# Patient Record
Sex: Female | Born: 2000 | Race: White | Hispanic: No | Marital: Married | Smoking: Never smoker
Health system: Southern US, Community
[De-identification: ages and names within clinical notes are randomized; demographics above are authoritative.]

## PROBLEM LIST (undated history)

## (undated) DIAGNOSIS — F419 Anxiety disorder, unspecified: Secondary | ICD-10-CM

## (undated) DIAGNOSIS — R002 Palpitations: Secondary | ICD-10-CM

## (undated) DIAGNOSIS — I4711 Inappropriate sinus tachycardia, so stated: Secondary | ICD-10-CM

## (undated) DIAGNOSIS — Q796 Ehlers-Danlos syndrome, unspecified: Secondary | ICD-10-CM

## (undated) DIAGNOSIS — G901 Familial dysautonomia [Riley-Day]: Secondary | ICD-10-CM

## (undated) DIAGNOSIS — T783XXA Angioneurotic edema, initial encounter: Secondary | ICD-10-CM

## (undated) DIAGNOSIS — G90A Postural orthostatic tachycardia syndrome (POTS): Secondary | ICD-10-CM

## (undated) DIAGNOSIS — I890 Lymphedema, not elsewhere classified: Secondary | ICD-10-CM

## (undated) DIAGNOSIS — L309 Dermatitis, unspecified: Secondary | ICD-10-CM

## (undated) HISTORY — DX: Dermatitis, unspecified: L30.9

## (undated) HISTORY — DX: Inappropriate sinus tachycardia, so stated: I47.11

## (undated) HISTORY — PX: NO PAST SURGERIES: SHX2092

## (undated) HISTORY — DX: Postural orthostatic tachycardia syndrome (POTS): G90.A

## (undated) HISTORY — DX: Ehlers-Danlos syndrome, unspecified: Q79.60

## (undated) HISTORY — DX: Palpitations: R00.2

## (undated) HISTORY — DX: Angioneurotic edema, initial encounter: T78.3XXA

## (undated) HISTORY — DX: Familial dysautonomia (riley-day): G90.1

## (undated) HISTORY — DX: Lymphedema, not elsewhere classified: I89.0

---
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
## 2019-04-27 ENCOUNTER — Encounter: Payer: Self-pay | Admitting: Registered Nurse

## 2019-05-25 ENCOUNTER — Encounter: Payer: Self-pay | Admitting: Registered Nurse

## 2019-06-12 ENCOUNTER — Encounter: Payer: Self-pay | Admitting: Gastroenterology

## 2019-07-18 ENCOUNTER — Ambulatory Visit: Payer: Medicaid Other | Admitting: Gastroenterology

## 2019-08-02 ENCOUNTER — Other Ambulatory Visit (HOSPITAL_COMMUNITY)
Admission: RE | Admit: 2019-08-02 | Discharge: 2019-08-02 | Disposition: A | Discharge status: Home / Self Care | Payer: Medicaid Other | Source: Ambulatory Visit | Attending: Obstetrics and Gynecology | Admitting: Obstetrics and Gynecology

## 2019-08-02 ENCOUNTER — Other Ambulatory Visit: Payer: Self-pay

## 2019-08-02 ENCOUNTER — Encounter: Payer: Self-pay | Admitting: Obstetrics and Gynecology

## 2019-08-02 ENCOUNTER — Ambulatory Visit (INDEPENDENT_AMBULATORY_CARE_PROVIDER_SITE_OTHER): Payer: Medicaid Other | Admitting: Obstetrics and Gynecology

## 2019-08-02 VITALS — BP 119/76 | HR 74 | Temp 97.4°F | Ht 64.0 in | Wt 150.4 lb

## 2019-08-02 DIAGNOSIS — N898 Other specified noninflammatory disorders of vagina: Secondary | ICD-10-CM | POA: Insufficient documentation

## 2019-08-02 DIAGNOSIS — N941 Unspecified dyspareunia: Secondary | ICD-10-CM

## 2019-08-02 DIAGNOSIS — R102 Pelvic and perineal pain: Secondary | ICD-10-CM | POA: Diagnosis present

## 2019-08-02 MED ORDER — SERTRALINE HCL 25 MG PO TABS: 25.0000 mg | ORAL_TABLET | Freq: Every day | ORAL | 3 refills | Status: DC

## 2019-08-02 MED ORDER — ESTROGENS, CONJUGATED 0.625 MG/GM VA CREA: 1.0000 | TOPICAL_CREAM | Freq: Every day | VAGINAL | 12 refills | Status: DC

## 2019-08-02 NOTE — Patient Instructions (Addendum)
The Female Pelvic Floor  The pelvic floor consists of several layers of muscles that cover the bottom of the pelvic cavity. These muscles have several distinct roles:  1. To support the pelvic organs, the bladder, uterus and colon within the pelvis. 2. To assist in stopping and starting the flow of urine or the passage of gas or stool.To aid in sexual appreciation.  How to Locate the Pelvic Floor Muscles  The Urine Stop Test . At the midstream of your urine flow, squeeze the pelvic floor muscles. You should feel the sensation of the openings close and the muscles pulling up and into the pelvic cavity.  If you have strong muscles you will slow or stop the stream of urine. . Stop or slow the flow of urine without tensing the muscles of your legs, buttocks. . Do this only to locate the muscles, not as a daily exercise. Feeling the Muscle . Place a fingertip on the anal opening. Contract and lift the muscles as though you are holding back gas or stool. You will feel your anal opening tighten. . Insert 1 or 2 fingers into the vagina to feel the contraction and lifting of the muscles. You should feel the opening of the vagina tighten around your finger. Watching the Muscle Contract . Begin by lying on a flat surface.  Position yourself with your knees apart and bent with your head elevated and supported on several pillows. Use a mirror to look at the anal and vaginal openings and the perineal body (the area between the two openings).  . Contract or tighten the muscles around the openings and watch for a lifting of the perineal body and closure of the openings.   . If you see a bulge or feel tissues coming out of your openings, this is an incorrect contraction and you should notify your health care provider for more instructions.  2007, [employer] Doc.11  Pelvic Floor Dysfunction  Pelvic floor dysfunction (PFD) is a condition that results when the group of muscles and connective  tissues that support the organs in the pelvis (pelvic floor muscles) do not work well. These muscles and their connections form a sling that supports the colon and bladder. In men, these muscles also support the prostate gland. In women, they also support the uterus. PFD causes pelvic floor muscles to be too weak, too tight, or a combination of both. In PFD, muscle movements are not coordinated. This condition may cause bowel or bladder problems. It may also cause pain. What are the causes? This condition may be caused by an injury to the pelvic area or by a weakening of pelvic muscles. This often results from pregnancy and childbirth or other types of strain. In many cases, the exact cause is not known. What increases the risk? The following factors may make you more likely to develop this condition:  Having a condition of chronic bladder tissue inflammation (interstitial cystitis).  Being an older person.  Being overweight.  Radiation treatment for cancer in the pelvic region.  Previous pelvic surgery, such as removal of the uterus (hysterectomy) or prostate gland (prostatectomy). What are the signs or symptoms? Symptoms of this condition vary and may include:  Bladder symptoms, such as: ? Trouble starting urination and emptying the bladder. ? Frequent urinary tract infections. ? Leaking urine when coughing, laughing, or exercising (stress incontinence). ? Having to pass urine urgently or frequently. ? Pain when passing urine.  Bowel symptoms, such as: ? Constipation. ? Urgent or  frequent bowel movements. ? Incomplete bowel movements. ? Painful bowel movements. ? Leaking stool or gas.  Unexplained genital or rectal pain.  Genital or rectal muscle spasms.  Low back pain. In women, symptoms of PFD may also include:  A heavy, full, or aching feeling in the vagina.  A bulge that protrudes into the vagina.  Pain during or after sexual intercourse. How is this  diagnosed? This condition may be diagnosed based on:  Your symptoms and medical history.  A physical exam. During the exam, your health care provider may check your pelvic muscles for tightness, spasm, pain, or weakness. This may include a rectal exam and a pelvic exam for women. In some cases, you may have diagnostic tests, such as:  Electrical muscle function tests.  Urine flow testing.  X-ray tests of bowel function.  Ultrasound of the pelvic organs. How is this treated? Treatment for this condition depends on your symptoms. Treatment options include:  Physical therapy. This may include Kegel exercises to help relax or strengthen the pelvic floor muscles.  Biofeedback. This type of therapy provides feedback on how tight your pelvic floor muscles are so that you can learn to control them.  Internal or external massage therapy.  A treatment that involves electrical stimulation of the pelvic floor muscles to help control pain (transcutaneous electrical nerve stimulation, or TENS).  Sound wave therapy (ultrasound) to reduce muscle spasms.  Medicines, such as: ? Muscle relaxants. ? Bladder control medicines. Surgery to reconstruct or support pelvic floor muscles may be an option if other treatments do not help. Follow these instructions at home: Activity  Do your usual activities as told by your health care provider. Ask your health care provider if you should modify any activities.  Do pelvic floor strengthening or relaxing exercises at home as told by your physical therapist. Lifestyle  Maintain a healthy weight.  Eat foods that are high in fiber, such as beans, whole grains, and fresh fruits and vegetables.  Limit foods that are high in fat and processed sugars, such as fried or sweet foods.  Manage stress with relaxation techniques such as yoga or meditation. General instructions  If you have problems with leakage: ? Use absorbable pads or wear padded  underwear. ? Wash frequently with mild soap. ? Keep your genital and anal area as clean and dry as possible. ? Ask your health care provider if you should try a barrier cream to prevent skin irritation.  Take warm baths to relieve pelvic muscle tension or spasms.  Take over-the-counter and prescription medicines only as told by your health care provider.  Keep all follow-up visits as told by your health care provider. This is important. Contact a health care provider if you:  Are not improving with home care.  Have signs or symptoms of PFD that get worse at home.  Develop new signs or symptoms at home.  Have signs of a urinary tract infection, such as: ? Fever. ? Chills. ? Urinary frequency. ? A burning feeling when urinating.  Have not had a bowel movement in 3 days (constipation). Summary  Pelvic floor dysfunction results when the muscles and connective tissues in your pelvic floor do not work well.  These muscles and their connections form a sling that supports your colon and bladder. In men, these muscles also support the prostate gland. In women, they also support the uterus.  PFD may be caused by an injury to the pelvic area or by a weakening of pelvic muscles.  PFD causes pelvic floor muscles to be too weak, too tight, or a combination of both. Symptoms may vary from person to person.  In most cases, PFD can be treated with physical therapies and medicines. Surgery may be an option if other treatments do not help. This information is not intended to replace advice given to you by your health care provider. Make sure you discuss any questions you have with your health care provider. Document Revised: 09/20/2017 Document Reviewed: 09/20/2017 Elsevier Patient Education  2020 [employer].

## 2019-08-02 NOTE — Progress Notes (Signed)
  GYNECOLOGY PROGRESS NOTE  History:  Ms. Betty Mcintosh is a 19 y.o. G1P1001 presents to [facility] office today for problem gyn visit. She reports BTB and abdominal pain that radiates down to vaginal area (not relieved with Tylenol), dyspareunia. She thinks her vaginal laceration was not repaired correctly immediately after her delivery. Contraception used is vasectomy.  She denies h/a, dizziness, shortness of breath, n/v, or fever/chills.    The following portions of the patient's history were reviewed and updated as appropriate: allergies, current medications, past family history, past medical history, past social history, past surgical history and problem list.   Review of Systems:  Pertinent items are noted in HPI.   Objective:  Physical Exam Blood pressure 119/76, pulse 74, temperature (!) 97.4 F (36.3 C), temperature source Oral, height 5\' 4"  (1.626 m), weight 150 lb 6.4 oz (68.2 kg), last menstrual period 07/20/2019, currently breastfeeding. VS reviewed, nursing note reviewed,  Constitutional: well developed, well nourished, no distress HEENT: normocephalic CV: normal rate Pulm/chest wall: normal effort Breast Exam: deferred Abdomen: soft Neuro: alert and oriented x 3 Skin: warm, dry Psych: affect normal Pelvic exam: Tight band of scar tissue palpated at introitus from 3 o'clock to 6 o'clock position -- same pain felt during SI is duplicated with digital palpation of scar tissue. Cervix pink, visually closed, without lesion, scant white creamy discharge, vaginal walls and external genitalia normal Bimanual exam: Cervix 0/long/high, firm, anterior, neg CMT, uterus nontender, nonenlarged, adnexa without tenderness, enlargement, or mass  Assessment & Plan:  1. Vaginal pain - Cervicovaginal ancillary only( [place]) - Rx for Estrace cream to be applied to affected area twice daily  2. Vaginal discharge - Cervicovaginal ancillary only( [place])  3. Dyspareunia in  female - Amb ref to rehab for pelvic floor dysfunction   09/19/2019, CNM 10:22 AM

## 2019-08-03 LAB — CERVICOVAGINAL ANCILLARY ONLY
Bacterial Vaginitis (gardnerella): NEGATIVE
Candida Glabrata: NEGATIVE
Candida Vaginitis: NEGATIVE
Chlamydia: NEGATIVE
Comment: NEGATIVE
Comment: NEGATIVE
Comment: NEGATIVE
Comment: NEGATIVE
Comment: NEGATIVE
Comment: NORMAL
Neisseria Gonorrhea: NEGATIVE
Trichomonas: NEGATIVE

## 2019-08-04 ENCOUNTER — Ambulatory Visit: Payer: Medicaid Other | Admitting: Gastroenterology

## 2019-09-01 ENCOUNTER — Encounter: Payer: Self-pay | Admitting: Physical Therapy

## 2019-09-01 ENCOUNTER — Other Ambulatory Visit: Payer: Self-pay

## 2019-09-01 ENCOUNTER — Ambulatory Visit: Payer: Medicaid Other | Attending: Obstetrics and Gynecology | Admitting: Physical Therapy

## 2019-09-01 DIAGNOSIS — R252 Cramp and spasm: Secondary | ICD-10-CM

## 2019-09-01 DIAGNOSIS — M6281 Muscle weakness (generalized): Secondary | ICD-10-CM

## 2019-09-01 DIAGNOSIS — R279 Unspecified lack of coordination: Secondary | ICD-10-CM | POA: Diagnosis present

## 2019-09-03 NOTE — Therapy (Signed)
[facility] [address], [address] [place], [place], [address] Phone: [phone]   Fax:  [phone]  Physical Therapy Evaluation  Patient Details  Name: Betty Mcintosh MRN: [id number] Date of Birth: [birth year] Referring Provider (PT): Raelyn Mora, CNM   Encounter Date: 09/01/2019   PT End of Session - 09/03/19 1909    Visit Number 1    Date for PT Re-Evaluation 11/24/19    PT Start Time 0845    PT Stop Time 0930    PT Time Calculation (min) 45 min    Activity Tolerance Patient tolerated treatment well;Patient limited by pain    Behavior During Therapy [facility] for tasks assessed/performed           Past Medical History:  Diagnosis Date  . Anxiety     Past Surgical History:  Procedure Laterality Date  . NO PAST SURGERIES      There were no vitals filed for this visit.    Subjective Assessment - 09/03/19 1900    Subjective I tore during delivery 10/22/18.  I have pelvic and back pain that is the worst when menstruating.  I can't run and walking    Limitations Walking;Standing;Lifting;Sitting    How long can you sit comfortably? playing on the floor with my son for an hour    Patient Stated Goals reduce pain    Currently in Pain? Yes    Pain Score 2    7-10/10 at the end of the day   Pain Location Vagina    Pain Orientation Left    Pain Descriptors / Indicators Throbbing;Pressure    Pain Type Chronic pain;Surgical pain    Pain Radiating Towards all over the pelvis by the end of the day    Pain Onset More than a month ago    Pain Frequency Intermittent    Aggravating Factors  gets worse when walking/moving; menstruating is unbearable; intercourse is painful intially    Pain Relieving Factors ibuprfen and tylenol    Effect of Pain on Daily Activities deal with the pain; sleeping is hard when menstruating; walking 2 miles is 10/10    Multiple Pain Sites No              [facility] PT Assessment - 09/03/19 0001       Assessment   Medical Diagnosis R10.2 (ICD-10-CM) - Vaginal pain; N94.10 (ICD-10-CM) - Dyspareunia in female    Referring Provider (PT) Raelyn Mora, CNM    Onset Date/Surgical Date 10/22/18    Prior Therapy No      Precautions   Precautions None      Balance Screen   Has the patient fallen in the past 6 months No      Home Environment   Living Environment Private residence    Living Arrangements Other relatives;Spouse/significant other;Children   almost 1y/o son and in-laws     Prior Function   Level of Independence Independent    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     Cognition   Overall Cognitive Status Within Functional Limits for tasks assessed      Posture/Postural Control   Posture/Postural Control Postural limitations    Postural Limitations Rounded Shoulders;Anterior pelvic tilt;Increased lumbar lordosis      ROM / Strength   AROM / PROM / Strength AROM;PROM;Strength      AROM   Overall AROM Comments lumbar 75%      PROM   Overall PROM Comments hip IR 50% bilateral; full  flexion and ER hips      Strength   Strength Assessment Site Hip;Knee    Right/Left Hip Right;Left    Right Hip Flexion 4-/5    Right Hip Extension 4+/5    Right Hip External Rotation  5/5    Right Hip Internal Rotation 5/5    Right Hip ABduction 4/5    Right Hip ADduction 4/5    Left Hip Flexion 3/5    Left Hip Extension 4/5    Left Hip External Rotation 3/5    Left Hip Internal Rotation 3/5    Left Hip ABduction 4-/5    Left Hip ADduction 3/5    Right/Left Knee Right;Left    Right Knee Flexion 5/5    Right Knee Extension 5/5    Left Knee Flexion 4/5    Left Knee Extension 4/5      Flexibility   Soft Tissue Assessment /Muscle Length yes    Hamstrings 80      Palpation   Palpation comment lumbar, hamstrings tight      Special Tests    Special Tests Lumbar    Lumbar Tests Straight Leg Raise      Straight Leg Raise   Findings Negative      Ambulation/Gait   Gait  Pattern Within Functional Limits                      Objective measurements completed on examination: See above findings.     Pelvic Floor Special Questions - 09/03/19 0001    Prior Pelvic/Prostate Exam Yes    Date of Last Pelvic/Prostate Exam --   last month   Are you Pregnant or attempting pregnancy? No    Number of Pregnancies 1    Number of Vaginal Deliveries 1    Any difficulty with labor and deliveries Yes    Diastasis Recti 2 fingers 1 knuckle at umbilicus    Currently Sexually Active Yes    Is this Painful Yes    Marinoff Scale discomfort that does not affect completion    Urinary Leakage Yes    How often with strong    Pad use 1/day if only    Activities that cause leaking With strong urge    Urinary frequency nocturia 4x/night; every hour about 4 oz    Fecal incontinence No    Fluid intake 8x16 oz    Falling out feeling (prolapse) Yes    Perineal Body/Introitus  Descended   scar present Lt   Pelvic Floor Internal Exam pt identity confirmed and infomred consent given    Exam Type Vaginal    Palpation very TTP scar on left, bilat OI, transverse peroneus, levators    Strength fair squeeze, definite lift    Strength # of reps --   7 rep in 10 sec   Strength # of seconds 4    Tone high                      PT Short Term Goals - 09/01/19 1047      PT SHORT TERM GOAL #1   Title Pt will demonstrate at least 4/5 MMT bilatera hips andknees for improved endurance in walking    Time 4    Period Weeks    Status New    Target Date 09/29/19      PT SHORT TERM GOAL #2   Title Pt will report 25% less intense pain throughout the day  Time 4    Period Weeks    Status New    Target Date 09/29/19             PT Long Term Goals - 09/03/19 1956      PT LONG TERM GOAL #1   Title Nocturia reduced to <2x/night    Baseline at least 4x / night    Time 12    Period Weeks    Status New    Target Date 11/24/19      PT LONG TERM GOAL #2    Title Pt will have at most 5/10 pain at the end of the day due to improved soft tissue and scar mobility    Baseline 10/10    Time 12    Period Weeks    Status New    Target Date 11/24/19      PT LONG TERM GOAL #3   Title Pt will be 5/5 MMT bilateral hips in order to improve pelvic stability for standing and walking    Baseline 3/5 to 4/5 throughout    Time 12    Period Weeks    Status New    Target Date 11/24/19      PT LONG TERM GOAL #4   Title Pt will have reduced tension in pelvic floor with being able to tolerate moderate pressure to muscles inclueding obudurator internus and levators for reduced dyspareunia    Baseline very TTP only tolerates pressure through fascial layer    Time 12    Period Weeks    Status New    Target Date 11/24/19      PT LONG TERM GOAL #5   Title Pt will have DRA reduced to <1 finger at umbility for greater core stability during functional tasks    Baseline 2 finger width    Time 12    Period Weeks    Status New    Target Date 11/24/19                  Plan - 09/03/19 1911    Clinical Impression Statement Pt presents to clinic due to pelvic pain that began after vaginal delivery on 10/22/18.  Pt has pain all of the time that increases with daily activities.  Pt has core weakness with DRA asmentioned.  LE weakness and pain with MMT as mentioned above.  Pt has increased muscle tone and spasms in lumbar, hamstrings and pelvic floor.  She lacks coordination with diaphragm, core and pelvic floor to manage abdominal pressure.  Pt will benefit from skilled PT to address impairments so she can return to full activity level and quality of life.    Personal Factors and Comorbidities Comorbidity 2;Time since onset of injury/illness/exacerbation    Comorbidities vaginal delivery with tearing and pt reports incorrectly done stitches    Examination-Activity Limitations Carry;Lift;Sit;Stand;Locomotion Level    Examination-Participation Restrictions  Cleaning;Community Activity    Stability/Clinical Decision Making Evolving/Moderate complexity    Clinical Decision Making Moderate    Rehab Potential Excellent    PT Frequency 1x / week    PT Duration 12 weeks    PT Treatment/Interventions ADLs/Self Care Home Management;Biofeedback;Moist Heat;Electrical Stimulation;Cryotherapy;Therapeutic activities;Therapeutic exercise;Neuromuscular re-education;Taping;Dry needling;Passive range of motion;Manual techniques;Patient/family education    PT Next Visit Plan internal STM and breathing;stretching pelvic floor    Consulted and Agree with Plan of Care Patient           Patient will benefit from skilled therapeutic intervention in order to improve the  following deficits and impairments:  Decreased strength, Impaired tone, Impaired flexibility, Increased muscle spasms, Difficulty walking, Decreased coordination, Increased fascial restricitons, Decreased range of motion, Postural dysfunction, Pain  Visit Diagnosis: Cramp and spasm  Muscle weakness (generalized)  Unspecified lack of coordination     Problem List Patient Active Problem List   Diagnosis Date Noted  . SVD (spontaneous vaginal delivery) 10/22/2018  . Post-dates pregnancy 10/21/2018  . Supervision of normal first pregnancy 09/23/2018  . Low weight gain during pregnancy in third trimester 08/11/2018    Junious Silk, PT 09/03/2019, 8:16 PM  [facility] [address] [place], [place], [address] Phone: [phone]   Fax:  [phone]  Name: Betty Mcintosh MRN: [id number] Date of Birth: 2000-12-31

## 2019-09-08 ENCOUNTER — Other Ambulatory Visit: Payer: Self-pay

## 2019-09-08 ENCOUNTER — Encounter: Payer: Self-pay | Admitting: Physical Therapy

## 2019-09-08 ENCOUNTER — Ambulatory Visit: Payer: Medicaid Other | Admitting: Physical Therapy

## 2019-09-08 DIAGNOSIS — M6281 Muscle weakness (generalized): Secondary | ICD-10-CM

## 2019-09-08 DIAGNOSIS — R252 Cramp and spasm: Secondary | ICD-10-CM

## 2019-09-08 DIAGNOSIS — R279 Unspecified lack of coordination: Secondary | ICD-10-CM

## 2019-09-08 NOTE — Therapy (Signed)
[facility] [address], [place], [place], [address] Phone: [phone]   Fax:  [phone]  Physical Therapy Treatment  Patient Details  Name: Betty Mcintosh MRN: [id number] Date of Birth: 08/05/2000 Referring Provider (PT): Laury Deep, CNM   Encounter Date: 09/08/2019   PT End of Session - 09/08/19 1218    Visit Number 2    Date for PT Re-Evaluation 11/24/19    PT Start Time 0938    PT Stop Time 1017    PT Time Calculation (min) 39 min    Activity Tolerance Patient tolerated treatment well;Patient limited by pain    Behavior During Therapy [facility] for tasks assessed/performed           Past Medical History:  Diagnosis Date  . Anxiety     Past Surgical History:  Procedure Laterality Date  . NO PAST SURGERIES      There were no vitals filed for this visit.   Subjective Assessment - 09/08/19 0938    Subjective I was sore for about a day after.  I can sit without perineal pain now.    How long can you sit comfortably? playing on the floor with my son for an hour    Patient Stated Goals reduce pain    Currently in Pain? No/denies    Multiple Pain Sites No                             [facility] Adult PT Treatment/Exercise - 09/08/19 0001      Self-Care   Self-Care Other Self-Care Comments    Other Self-Care Comments  dry needling info and self massage      Manual Therapy   Manual Therapy Soft tissue mobilization;Internal Pelvic Floor    Manual therapy comments pt identity confirmed and informed consent given to perform in ternal sof ttissue work    Soft tissue mobilization lumbar and thoracic paraspinals    Internal Pelvic Floor scar tissue and fascial release perineum and just to bulbocavernosis                  PT Education - 09/08/19 1217    Education Details self stretch; dry needling info    Person(s) Educated Patient    Methods Explanation;Demonstration;Verbal  cues;Handout    Comprehension Verbalized understanding;Returned demonstration            PT Short Term Goals - 09/01/19 1047      PT SHORT TERM GOAL #1   Title Pt will demonstrate at least 4/5 MMT bilatera hips andknees for improved endurance in walking    Time 4    Period Weeks    Status New    Target Date 09/29/19      PT SHORT TERM GOAL #2   Title Pt will report 25% less intense pain throughout the day    Time 4    Period Weeks    Status New    Target Date 09/29/19             PT Long Term Goals - 09/03/19 1956      PT LONG TERM GOAL #1   Title Nocturia reduced to <2x/night    Baseline at least 4x / night    Time 12    Period Weeks    Status New    Target Date 11/24/19      PT LONG TERM GOAL #2   Title Pt will  have at most 5/10 pain at the end of the day due to improved soft tissue and scar mobility    Baseline 10/10    Time 12    Period Weeks    Status New    Target Date 11/24/19      PT LONG TERM GOAL #3   Title Pt will be 5/5 MMT bilateral hips in order to improve pelvic stability for standing and walking    Baseline 3/5 to 4/5 throughout    Time 12    Period Weeks    Status New    Target Date 11/24/19      PT LONG TERM GOAL #4   Title Pt will have reduced tension in pelvic floor with being able to tolerate moderate pressure to muscles inclueding obudurator internus and levators for reduced dyspareunia    Baseline very TTP only tolerates pressure through fascial layer    Time 12    Period Weeks    Status New    Target Date 11/24/19      PT LONG TERM GOAL #5   Title Pt will have DRA reduced to <1 finger at umbility for greater core stability during functional tasks    Baseline 2 finger width    Time 12    Period Weeks    Status New    Target Date 11/24/19                 Plan - 09/08/19 1219    Clinical Impression Statement Pt tolerated soft tissue work today.  She states she had less pain when sitting, but was in a lot of pain  after LE MMT from eval.  Pt feeling okay today.  Pt was educated on doing self massage at home to desensitize and was given info on dry needling.  Pt has very tight lumbar paraspinals and adductors.  Pt will benefit from skilled PT to continue working on improved soft tissue quality and hip and core strength    PT Treatment/Interventions ADLs/Self Care Home Management;Biofeedback;Moist Heat;Electrical Stimulation;Cryotherapy;Therapeutic activities;Therapeutic exercise;Neuromuscular re-education;Taping;Dry needling;Passive range of motion;Manual techniques;Patient/family education    PT Next Visit Plan f/u on self massage; breathing and bulging in different positions; gentle core and hip strength    Consulted and Agree with Plan of Care Patient           Patient will benefit from skilled therapeutic intervention in order to improve the following deficits and impairments:  Decreased strength, Impaired tone, Impaired flexibility, Increased muscle spasms, Difficulty walking, Decreased coordination, Increased fascial restricitons, Decreased range of motion, Postural dysfunction, Pain  Visit Diagnosis: No diagnosis found.     Problem List Patient Active Problem List   Diagnosis Date Noted  . SVD (spontaneous vaginal delivery) 10/22/2018  . Post-dates pregnancy 10/21/2018  . Supervision of normal first pregnancy 09/23/2018  . Low weight gain during pregnancy in third trimester 08/11/2018    Junious Silk, PT 09/08/2019, 12:25 PM  [facility] [address], [address] [place], [place], [address] Phone: [phone]   Fax:  [phone]  Name: Cammi Consalvo MRN: [id number] Date of Birth: 06-30-2000

## 2019-09-08 NOTE — Patient Instructions (Addendum)
STRETCHING THE PELVIC FLOOR MUSCLES NO DILATOR  Supplies  Vaginal lubricant  Mirror (optional)  Gloves (optional) or clean hands Positioning  Start in a semi-reclined position with your head propped up. Bend your knees and place your thumb or finger at the vaginal opening. Procedure  Apply a moderate amount of lubricant on the outer skin of your vagina, the labia minora.  Apply additional lubricant to your finger.  Spread the skin away from the vaginal opening. Place the end of your finger at the opening.  Do a maximum contraction of the pelvic floor muscles. Tighten the vagina and the anus maximally and relax.  When you know they are relaxed, gently and slowly insert your finger into your vagina, directing your finger slightly downward, for 2-3 inches of insertion.  Relax and stretch the 6 oclock position  Hold each stretch for _30-60 seconds, no pain more than 3/10  Repeat the stretching in the 4 oclock and 8 oclock positions.  Next gently move your finger in a "U" shape  several times.   You can also enter a second finger to work to spread the vaginal opening wider from 3:00-6:00 and 6:00-9:00 or 3:00-9:00  Perform daily or every other day  Once you have accomplished the techniques you may try them in standing with one foot resting on the tub, or in other positions.  This is a good stretch to do in the shower if you dont need to use lubricant.  This can be done at 35 weeks or later in your pregnancy.   Trigger Point Dry Needling   What is Trigger Point Dry Needling (DN)? o DN is a physical therapy technique used to treat muscle pain and dysfunction. Specifically, DN helps deactivate muscle trigger points (muscle knots).  o A thin filiform needle is used to penetrate the skin and stimulate the underlying trigger point. The goal is for a local twitch response (LTR) to occur and for the trigger point to relax. No medication of any kind is injected during the procedure.     What Does Trigger Point Dry Needling Feel Like?  o The procedure feels different for each individual patient. Some patients report that they do not actually feel the needle enter the skin and overall the process is not painful. Very mild bleeding may occur. However, many patients feel a deep cramping in the muscle in which the needle was inserted. This is the local twitch response.    How Will I feel after the treatment? o Soreness is normal, and the onset of soreness may not occur for a few hours. Typically this soreness does not last longer than two days.  o Bruising is uncommon, however; ice can be used to decrease any possible bruising.  o In rare cases feeling tired or nauseous after the treatment is normal. In addition, your symptoms may get worse before they get better, this period will typically not last longer than 24 hours.    What Can I do After My Treatment? o Increase your hydration by drinking more water for the next 24 hours. o You may place ice or heat on the areas treated that have become sore, however, do not use heat on inflamed or bruised areas. Heat often brings more relief post needling. o You can continue your regular activities, but vigorous activity is not recommended initially after the treatment for 24 hours. o DN is best combined with other physical therapy such as strengthening, stretching, and other therapies.    [facility] [address], [address] [place], [place] [address] Phone # [phone] Fax [phone]

## 2019-09-22 ENCOUNTER — Ambulatory Visit: Payer: Medicaid Other | Attending: Obstetrics and Gynecology | Admitting: Physical Therapy

## 2019-09-22 ENCOUNTER — Other Ambulatory Visit: Payer: Self-pay

## 2019-09-22 DIAGNOSIS — R252 Cramp and spasm: Secondary | ICD-10-CM | POA: Diagnosis present

## 2019-09-22 DIAGNOSIS — M6281 Muscle weakness (generalized): Secondary | ICD-10-CM

## 2019-09-22 DIAGNOSIS — R279 Unspecified lack of coordination: Secondary | ICD-10-CM

## 2019-09-22 NOTE — Therapy (Signed)
[facility] [address], [address] [place], [place], [address] Phone: [phone]   Fax:  [phone]  Physical Therapy Treatment  Patient Details  Name: Betty Mcintosh MRN: [id number] Date of Birth: [birth year] Referring Provider (PT): Raelyn Mora, CNM   Encounter Date: 09/22/2019   PT End of Session - 09/22/19 1023    Visit Number 3    Date for PT Re-Evaluation 11/24/19    PT Start Time 0933    PT Stop Time 1013    PT Time Calculation (min) 40 min    Activity Tolerance Patient tolerated treatment well;Patient limited by pain    Behavior During Therapy [facility] for tasks assessed/performed           Past Medical History:  Diagnosis Date  . Anxiety     Past Surgical History:  Procedure Laterality Date  . NO PAST SURGERIES      There were no vitals filed for this visit.   Subjective Assessment - 09/22/19 0937    Subjective I was sore every since the last visit    Patient Stated Goals reduce pain    Currently in Pain? Yes    Pain Score 6     Pain Location Perineum    Pain Descriptors / Indicators Aching;Dull    Pain Type Chronic pain    Pain Onset More than a month ago    Pain Frequency Intermittent    Aggravating Factors  has been 9/10 finally a little better today    Multiple Pain Sites No                             [facility] Adult PT Treatment/Exercise - 09/22/19 0001      Neuro Re-ed    Neuro Re-ed Details  TrA activation      Exercises   Exercises Lumbar      Lumbar Exercises: Supine   Ab Set 15 reps;3 seconds    AB Set Limitations with ball press; exhale ; ball overhead; hands on thighs      Manual Therapy   Soft tissue mobilization lumbar and thoracic paraspinals; upper trap                  PT Education - 09/22/19 1023    Education Details Access Code: [email or web address]    Person(s) Educated Patient    Methods Explanation;Demonstration;Handout;Verbal cues    Comprehension  Verbalized understanding;Returned demonstration            PT Short Term Goals - 09/01/19 1047      PT SHORT TERM GOAL #1   Title Pt will demonstrate at least 4/5 MMT bilatera hips andknees for improved endurance in walking    Time 4    Period Weeks    Status New    Target Date 09/29/19      PT SHORT TERM GOAL #2   Title Pt will report 25% less intense pain throughout the day    Time 4    Period Weeks    Status New    Target Date 09/29/19             PT Long Term Goals - 09/03/19 1956      PT LONG TERM GOAL #1   Title Nocturia reduced to <2x/night    Baseline at least 4x / night    Time 12    Period Weeks    Status New    Target  Date 11/24/19      PT LONG TERM GOAL #2   Title Pt will have at most 5/10 pain at the end of the day due to improved soft tissue and scar mobility    Baseline 10/10    Time 12    Period Weeks    Status New    Target Date 11/24/19      PT LONG TERM GOAL #3   Title Pt will be 5/5 MMT bilateral hips in order to improve pelvic stability for standing and walking    Baseline 3/5 to 4/5 throughout    Time 12    Period Weeks    Status New    Target Date 11/24/19      PT LONG TERM GOAL #4   Title Pt will have reduced tension in pelvic floor with being able to tolerate moderate pressure to muscles inclueding obudurator internus and levators for reduced dyspareunia    Baseline very TTP only tolerates pressure through fascial layer    Time 12    Period Weeks    Status New    Target Date 11/24/19      PT LONG TERM GOAL #5   Title Pt will have DRA reduced to <1 finger at umbility for greater core stability during functional tasks    Baseline 2 finger width    Time 12    Period Weeks    Status New    Target Date 11/24/19                 Plan - 09/22/19 1159    Clinical Impression Statement Today's session focused on core and diaphragm activation.  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  Pt did well with basic core strength.  She will take a break from internal soft tissue work until that has calmed down  She was still irritated from previous session.    PT Treatment/Interventions ADLs/Self Care Home Management;Biofeedback;Moist Heat;Electrical Stimulation;Cryotherapy;Therapeutic activities;Therapeutic exercise;Neuromuscular re-education;Taping;Dry needling;Passive range of motion;Manual techniques;Patient/family education    PT Next Visit Plan progress core and hip strength as tolerated    PT Home Exercise Plan Access Code: [email or web address]    Consulted and Agree with Plan of Care Patient           Patient will benefit from skilled therapeutic intervention in order to improve the following deficits and impairments:  Decreased strength, Impaired tone, Impaired flexibility, Increased muscle spasms, Difficulty walking, Decreased coordination, Increased fascial restricitons, Decreased range of motion, Postural dysfunction, Pain  Visit Diagnosis: Cramp and spasm  Unspecified lack of coordination  Muscle weakness (generalized)     Problem List Patient Active Problem List   Diagnosis Date Noted  . SVD (spontaneous vaginal delivery) 10/22/2018  . Post-dates pregnancy 10/21/2018  . Supervision of normal first pregnancy 09/23/2018  . Low weight gain during pregnancy in third trimester 08/11/2018    Junious Silk, PT 09/22/2019, 12:04 PM  [facility] [address] [place], [place], [address] Phone: [phone]   Fax:  [phone]  Name: Betty Mcintosh MRN: [id number] Date of Birth: 07-29-2000

## 2019-09-22 NOTE — Patient Instructions (Signed)
Access Code: [email or web address] URL: [email or web address] Date: 09/22/2019 Prepared by: Dwana Curd  Exercises Diaphragmatic Breathing in Supported Child's Pose with Pelvic Floor Relaxation - 1 x daily - 7 x weekly - 1 sets - 1 reps - 5 min hold Supine Diaphragmatic Breathing - 3 x daily - 7 x weekly - 10 reps - 1 sets Supine Breathing with Hands on Ribcage - 1 x daily - 7 x weekly - 1 sets - 10 reps Supine Transversus Abdominis Bracing - Hands on Thighs - 1 x daily - 7 x weekly - 10 reps - 1 sets - 5 sec hold

## 2019-09-25 ENCOUNTER — Other Ambulatory Visit: Payer: Self-pay

## 2019-09-25 ENCOUNTER — Encounter (HOSPITAL_COMMUNITY): Payer: Self-pay

## 2019-09-25 ENCOUNTER — Ambulatory Visit (HOSPITAL_COMMUNITY)
Admission: EM | Admit: 2019-09-25 | Discharge: 2019-09-25 | Disposition: A | Discharge status: Home / Self Care | Payer: Medicaid Other | Attending: Family Medicine | Admitting: Family Medicine

## 2019-09-25 DIAGNOSIS — R1031 Right lower quadrant pain: Secondary | ICD-10-CM | POA: Diagnosis not present

## 2019-09-25 NOTE — Discharge Instructions (Addendum)
Continue warmth Take ibuprofen 600-800 mg for pain Follow up with OB GYN

## 2019-09-25 NOTE — ED Triage Notes (Signed)
Pt presents with recurring lower abdominal/pelvic pain that radiates down into her vagina.  Pt states has followed up with her GYN about it and they didn't have sooner appts for her to get seen so they referred her here.

## 2019-09-26 ENCOUNTER — Telehealth: Payer: Self-pay | Admitting: *Deleted

## 2019-09-26 NOTE — Telephone Encounter (Signed)
Patient left message with triage nurse over lunch. Return call to patient, patient stated she was seen at urgent care yesterday. She requested an ultrasound, however they told her to follow up with provider. Advised patient that she will need to be seen by one of our provider to assess need for ultrasound. Patient reported she has an appointment today with PCP, advised patient to consult with PCP.  Clovis Pu, RN

## 2019-09-28 NOTE — ED Provider Notes (Signed)
[facility]    CSN: [id number] Arrival date & time: 09/25/19  1842      History   Chief Complaint Chief Complaint  Patient presents with   Abdominal Pain    HPI Betty Mcintosh is a 19 y.o. female.   HPI  Chart is reviewed Patient is being followed by GYN for abdominal and pelvic pain that radiates into her vagina. She has been given medications She has been treated with physical therapy She is here today because she feels like her pain is worse She states she called her GYN and they told her to go to the urgent care center if she needed to be seen She tells me that her preferred provider was not available for couple of weeks She is not having any vaginal bleeding.  No unprotected relations or question of STD.  No vaginal discharge.  No fever chills.  No rash.  No nausea vomiting  Past Medical History:  Diagnosis Date   Anxiety     Patient Active Problem List   Diagnosis Date Noted   SVD (spontaneous vaginal delivery) 10/22/2018   Post-dates pregnancy 10/21/2018   Supervision of normal first pregnancy 09/23/2018   Low weight gain during pregnancy in third trimester 08/11/2018    Past Surgical History:  Procedure Laterality Date   NO PAST SURGERIES      OB History    Gravida  1   Para  1   Term  1   Preterm      AB      Living  1     SAB      TAB      Ectopic      Multiple  0   Live Births  1            Home Medications    Prior to Admission medications   Medication Sig Start Date End Date Taking? Authorizing Provider  conjugated estrogens (PREMARIN) vaginal cream Place 1 Applicatorful vaginally daily. 08/02/19   Raelyn Mora, CNM  ELDERBERRY PO Take 1 tablet by mouth daily.    [provider]  Pediatric Multiple Vit-Vit C (VITAMIN DAILY PO) Take 6,000 Int'l Units/day by mouth daily.    [provider]  Prenatal Vit-Fe Fumarate-FA (MULTIVITAMIN-PRENATAL) 27-0.8 MG TABS tablet Take 1 tablet by mouth  daily at 12 noon.    [provider]  Probiotic Product (PROBIOTIC DAILY PO) Take 1 tablet by mouth daily.    [provider]    Family History Family History  Problem Relation Age of Onset   Hypertension Mother    Breast cancer Neg Hx    Colon cancer Neg Hx    Ovarian cancer Neg Hx     Social History Social History   Tobacco Use   Smoking status: Never Smoker   Smokeless tobacco: Never Used  Building services engineer Use: Never used  Substance Use Topics   Alcohol use: Never   Drug use: Never     Allergies   Lactose intolerance (gi)   Review of Systems Review of Systems See HPI  Physical Exam Triage Vital Signs ED Triage Vitals  Enc Vitals Group     BP 09/25/19 2019 127/80     Pulse Rate 09/25/19 2019 73     Resp 09/25/19 2019 18     Temp 09/25/19 2019 98.1 F (36.7 C)     Temp Source 09/25/19 2019 Oral     SpO2 09/25/19 2019 99 %  Weight --      Height --      Head Circumference --      Peak Flow --      Pain Score 09/25/19 2017 4     Pain Loc --      Pain Edu? --      Excl. in GC? --    No data found.  Updated Vital Signs BP 127/80 (BP Location: Right Arm)    Pulse 73    Temp 98.1 F (36.7 C) (Oral)    Resp 18    SpO2 99%      Physical Exam Constitutional:      General: She is not in acute distress.    Appearance: She is well-developed.     Comments: In no distress  HENT:     Head: Normocephalic and atraumatic.  Eyes:     Conjunctiva/sclera: Conjunctivae normal.     Pupils: Pupils are equal, round, and reactive to light.  Cardiovascular:     Rate and Rhythm: Normal rate.  Pulmonary:     Effort: Pulmonary effort is normal. No respiratory distress.  Abdominal:     General: There is no distension.     Palpations: Abdomen is soft.     Tenderness: There is no abdominal tenderness.  Musculoskeletal:        General: Normal range of motion.     Cervical back: Normal range of motion.  Skin:    General: Skin is warm  and dry.  Neurological:     Mental Status: She is alert.  Psychiatric:        Mood and Affect: Mood normal.        Behavior: Behavior normal.      UC Treatments / Results  Labs (all labs ordered are listed, but only abnormal results are displayed) Labs Reviewed - No data to display  EKG   Radiology No results found.  Procedures Procedures (including critical care time)  Medications Ordered in UC Medications - No data to display  Initial Impression / Assessment and Plan / UC Course  I have reviewed the triage vital signs and the nursing notes.  Pertinent labs & imaging results that were available during my care of the patient were reviewed by me and considered in my medical decision making (see chart for details).     I explained to the patient that we have little to offer in addition to a GYN opinion regarding her abdominal or vaginal pain. Patient is desiring an ultrasound.  I told her that we do not have ultrasound capabilities at the urgent care center. Patient is advised to call her GYN tomorrow for advice Final Clinical Impressions(s) / UC Diagnoses   Final diagnoses:  Right lower quadrant abdominal pain     Discharge Instructions     Continue warmth Take ibuprofen 600-800 mg for pain Follow up with OB GYN   ED Prescriptions    None     PDMP not reviewed this encounter.   Eustace Moore, MD 09/28/19 1139

## 2019-09-29 ENCOUNTER — Other Ambulatory Visit: Payer: Self-pay

## 2019-09-29 ENCOUNTER — Ambulatory Visit: Payer: Medicaid Other | Admitting: Physical Therapy

## 2019-09-29 ENCOUNTER — Encounter: Payer: Self-pay | Admitting: Physical Therapy

## 2019-09-29 DIAGNOSIS — R279 Unspecified lack of coordination: Secondary | ICD-10-CM

## 2019-09-29 DIAGNOSIS — M6281 Muscle weakness (generalized): Secondary | ICD-10-CM

## 2019-09-29 DIAGNOSIS — R252 Cramp and spasm: Secondary | ICD-10-CM

## 2019-09-29 NOTE — Therapy (Signed)
[facility] [address] [place], [place], [address] Phone: [phone]   Fax:  [phone]  Physical Therapy Treatment  Patient Details  Name: Betty Mcintosh MRN: [id number] Date of Birth: 2000-07-06 Referring Provider (PT): Raelyn Mora, CNM   Encounter Date: 09/29/2019   PT End of Session - 09/29/19 1030    Visit Number 4    Date for PT Re-Evaluation 11/24/19    PT Start Time 0934    PT Stop Time 1014    PT Time Calculation (min) 40 min    Activity Tolerance Patient tolerated treatment well;Patient limited by pain    Behavior During Therapy [facility] for tasks assessed/performed           Past Medical History:  Diagnosis Date  . Anxiety     Past Surgical History:  Procedure Laterality Date  . NO PAST SURGERIES      There were no vitals filed for this visit.   Subjective Assessment - 09/29/19 0939    Subjective I had an ultrasound and was flared up for days after that.  Pt sates she has no pain currently unless touching the spot    Patient Stated Goals reduce pain    Currently in Pain? No/denies                             [facility] Adult PT Treatment/Exercise - 09/29/19 0001      Self-Care   Other Self-Care Comments  self fascial release around labia      Neuro Re-ed    Neuro Re-ed Details  TrA activation      Lumbar Exercises: Stretches   Other Lumbar Stretch Exercise lumbar flexion stretch on ball - tried two way but some increased pain in upper back      Lumbar Exercises: Aerobic   UBE (Upper Arm Bike) L1 3/3 fwd/back - PT present for status update      Lumbar Exercises: Quadruped   Other Quadruped Lumbar Exercises modified qped standing with forearms on mat table height - TrA activation and rocking - 10x each    Other Quadruped Lumbar Exercises TrA in qped and rocking - heavy TC and unable to consistently keep back flat - increased lumbar ext      Manual Therapy   Manual  therapy comments lumbar and thoracic fascial release    Soft tissue mobilization lumbar and thoracic paraspinals; upper trap                  PT Education - 09/29/19 1037    Education Details Access Code: [email or web address]    Person(s) Educated Patient    Methods Explanation;Demonstration;Tactile cues;Verbal cues;Handout    Comprehension Verbalized understanding;Returned demonstration            PT Short Term Goals - 09/29/19 1036      PT SHORT TERM GOAL #1   Title Pt will demonstrate at least 4/5 MMT bilatera hips andknees for improved endurance in walking    Baseline barely began with hip strengthening yet and not added to HEP    Status On-going      PT SHORT TERM GOAL #2   Title Pt will report 25% less intense pain throughout the day    Baseline same    Status On-going             PT Long Term Goals - 09/03/19 1956      PT LONG TERM  GOAL #1   Title Nocturia reduced to <2x/night    Baseline at least 4x / night    Time 12    Period Weeks    Status New    Target Date 11/24/19      PT LONG TERM GOAL #2   Title Pt will have at most 5/10 pain at the end of the day due to improved soft tissue and scar mobility    Baseline 10/10    Time 12    Period Weeks    Status New    Target Date 11/24/19      PT LONG TERM GOAL #3   Title Pt will be 5/5 MMT bilateral hips in order to improve pelvic stability for standing and walking    Baseline 3/5 to 4/5 throughout    Time 12    Period Weeks    Status New    Target Date 11/24/19      PT LONG TERM GOAL #4   Title Pt will have reduced tension in pelvic floor with being able to tolerate moderate pressure to muscles inclueding obudurator internus and levators for reduced dyspareunia    Baseline very TTP only tolerates pressure through fascial layer    Time 12    Period Weeks    Status New    Target Date 11/24/19      PT LONG TERM GOAL #5   Title Pt will have DRA reduced to <1 finger at umbility for greater core stability  during functional tasks    Baseline 2 finger width    Time 12    Period Weeks    Status New    Target Date 11/24/19                 Plan - 09/29/19 1030    Clinical Impression Statement Pt did well with core strengthening in modified quadruped today which added challenge to the basic exercises from last session.  Pt was not able to do in normal qped position due to needing very heavy TC to maintain neutral spine.  Pt had good fascial release in thoracic and lumbar regions prior to doing abdominal work.  Continue with fascial release and core strengthening to see if she can reduce stress around the pelvic floor for reduced nerve pain.    PT Treatment/Interventions ADLs/Self Care Home Management;Biofeedback;Moist Heat;Electrical Stimulation;Cryotherapy;Therapeutic activities;Therapeutic exercise;Neuromuscular re-education;Taping;Dry needling;Passive range of motion;Manual techniques;Patient/family education    PT Next Visit Plan progress core and hip strength as tolerated; lumbar and thoracic fascial release    PT Home Exercise Plan Access Code: [email or web address]    Consulted and Agree with Plan of Care Patient           Patient will benefit from skilled therapeutic intervention in order to improve the following deficits and impairments:  Decreased strength, Impaired tone, Impaired flexibility, Increased muscle spasms, Difficulty walking, Decreased coordination, Increased fascial restricitons, Decreased range of motion, Postural dysfunction, Pain  Visit Diagnosis: Cramp and spasm  Unspecified lack of coordination  Muscle weakness (generalized)     Problem List Patient Active Problem List   Diagnosis Date Noted  . SVD (spontaneous vaginal delivery) 10/22/2018  . Post-dates pregnancy 10/21/2018  . Supervision of normal first pregnancy 09/23/2018  . Low weight gain during pregnancy in third trimester 08/11/2018    Junious Silk, PT 09/29/2019, 10:44 AM  [facility] [address] [place], [place], [address] Phone: [phone]   Fax:  [phone]  Name: Betty Mcintosh MRN: [id number] Date of Birth: 10/21/2000

## 2019-09-29 NOTE — Patient Instructions (Signed)
Access Code: [email or web address] URL: [email or web address] Date: 09/29/2019 Prepared by: Dwana Curd  Exercises Diaphragmatic Breathing in Supported Child's Pose with Pelvic Floor Relaxation - 1 x daily - 7 x weekly - 1 sets - 1 reps - 5 min hold Supine Diaphragmatic Breathing - 3 x daily - 7 x weekly - 10 reps - 1 sets Supine Breathing with Hands on Ribcage - 1 x daily - 7 x weekly - 1 sets - 10 reps Supine Transversus Abdominis Bracing - Hands on Thighs - 1 x daily - 7 x weekly - 10 reps - 1 sets - 5 sec hold Quadruped Rocking Slow - 1 x daily - 7 x weekly - 3 sets - 10 reps

## 2019-10-13 ENCOUNTER — Ambulatory Visit: Payer: Medicaid Other | Admitting: Physical Therapy

## 2019-10-20 ENCOUNTER — Encounter: Payer: Self-pay | Admitting: Physical Therapy

## 2019-10-20 ENCOUNTER — Ambulatory Visit: Payer: Medicaid Other | Attending: Obstetrics and Gynecology | Admitting: Physical Therapy

## 2019-10-20 ENCOUNTER — Other Ambulatory Visit: Payer: Self-pay

## 2019-10-20 DIAGNOSIS — R279 Unspecified lack of coordination: Secondary | ICD-10-CM | POA: Insufficient documentation

## 2019-10-20 DIAGNOSIS — M6281 Muscle weakness (generalized): Secondary | ICD-10-CM | POA: Diagnosis present

## 2019-10-20 DIAGNOSIS — R252 Cramp and spasm: Secondary | ICD-10-CM | POA: Insufficient documentation

## 2019-10-20 NOTE — Therapy (Addendum)
[facility] [address], [place], [place], [address] Phone: [phone]   Fax:  [phone]  Physical Therapy Treatment  Patient Details  Name: Betty Mcintosh MRN: [id number] Date of Birth: 24-Jan-2001 Referring Provider (PT): Laury Deep, CNM   Encounter Date: 10/20/2019   PT End of Session - 10/20/19 1121    Visit Number 5    Date for PT Re-Evaluation 11/24/19    PT Start Time 0931    PT Stop Time 1012    PT Time Calculation (min) 41 min    Activity Tolerance Patient tolerated treatment well;Patient limited by pain    Behavior During Therapy [facility] for tasks assessed/performed           Past Medical History:  Diagnosis Date  . Anxiety     Past Surgical History:  Procedure Laterality Date  . NO PAST SURGERIES      There were no vitals filed for this visit.   Subjective Assessment - 10/20/19 0935    Subjective I am feeling better overall, the pain is not constant.  There are random days when it is 10/10, no reason    Currently in Pain? Yes    Pain Score 3     Pain Location Perineum    Pain Orientation Left    Pain Descriptors / Indicators Sore    Pain Onset More than a month ago    Aggravating Factors  pressure from clothing or anything touching    Pain Relieving Factors not touching it    Multiple Pain Sites No                             [facility] Adult PT Treatment/Exercise - 10/20/19 0001      Neuro Re-ed    Neuro Re-ed Details  TrA activation      Lumbar Exercises: Standing   Other Standing Lumbar Exercises shoulder ext and flex isometric for TrA activation - 15x 3 sec hold      Lumbar Exercises: Seated   Hip Flexion on Ball Strengthening;Right;Left;20 reps    Other Seated Lumbar Exercises circles on pball       Lumbar Exercises: Supine   Other Supine Lumbar Exercises supine on foam roller - UE flex holding red ball; LE fallout and marches; ball squeezes - 15x each       Lumbar Exercises: Quadruped   Other Quadruped Lumbar Exercises modified qped standing with forearms on mat table height - TrA activation and rocking - 10x each; LE extension 10x and inclined push up 5x      Manual Therapy   Manual therapy comments pt identity confirmed and informed consent given to perform in ternal sof ttissue work    Internal Pelvic Floor external perineum fascial release - loosened on Lt side; Rt side continues to feel sore                    PT Short Term Goals - 10/20/19 1054      PT SHORT TERM GOAL #1   Title Pt will demonstrate at least 4/5 MMT bilatera hips andknees for improved endurance in walking    Status On-going      PT SHORT TERM GOAL #2   Title Pt will report 25% less intense pain throughout the day    Baseline much less overall    Status Achieved  PT Long Term Goals - 10/20/19 1055      PT LONG TERM GOAL #1   Title Nocturia reduced to <2x/night    Baseline 1-2/night    Status Achieved      PT LONG TERM GOAL #2   Title Pt will have at most 5/10 pain at the end of the day due to improved soft tissue and scar mobility    Baseline 10/10 on a bad day    Status On-going      PT LONG TERM GOAL #3   Title Pt will be 5/5 MMT bilateral hips in order to improve pelvic stability for standing and walking    Status On-going      PT LONG TERM GOAL #4   Title Pt will have reduced tension in pelvic floor with being able to tolerate moderate pressure to muscles inclueding obudurator internus and levators for reduced dyspareunia    Status On-going      PT LONG TERM GOAL #5   Title Pt will have DRA reduced to <1 finger at umbility for greater core stability during functional tasks    Status On-going                 Plan - 10/20/19 1034    Clinical Impression Statement Pt reports she is much better overall. Pt tolerated soft tissue MFR to perineum today but did not do internal due to feeling very TTP and didn't want to flare  things up.  Pt did well with core exercises, still having difficulty activating the TrA, but replaced exercises in HEP to include 2 that she felt were more effective.  Pt will need skilled PT to continue with core strength and improved soft tissue quality in perineum for functional activities without pain.    PT Treatment/Interventions ADLs/Self Care Home Management;Biofeedback;Moist Heat;Electrical Stimulation;Cryotherapy;Therapeutic activities;Therapeutic exercise;Neuromuscular re-education;Taping;Dry needling;Passive range of motion;Manual techniques;Patient/family education    PT Next Visit Plan TrA activation f/u on shoulder ext and ball squeeze; f/u on perineal massage if it was tolerable; core in various positions    PT Home Exercise Plan Access Code: [email or web address]    Consulted and Agree with Plan of Care Patient           Patient will benefit from skilled therapeutic intervention in order to improve the following deficits and impairments:  Decreased strength, Impaired tone, Impaired flexibility, Increased muscle spasms, Difficulty walking, Decreased coordination, Increased fascial restricitons, Decreased range of motion, Postural dysfunction, Pain  Visit Diagnosis: Cramp and spasm  Unspecified lack of coordination  Muscle weakness (generalized)     Problem List Patient Active Problem List   Diagnosis Date Noted  . SVD (spontaneous vaginal delivery) 10/22/2018  . Post-dates pregnancy 10/21/2018  . Supervision of normal first pregnancy 09/23/2018  . Low weight gain during pregnancy in third trimester 08/11/2018    Jule Ser, PT 10/20/2019, 11:47 AM  [facility] [address] [place], [place], [address] Phone: [phone]   Fax:  [phone]  Name: Betty Mcintosh MRN: [id number] Date of Birth: Feb 15, 2001  PHYSICAL THERAPY DISCHARGE SUMMARY  Visits from Start of Care: 5  Current functional level related to  goals / functional outcomes: See above goals Remaining deficits: See above details   Education / Equipment: HEP  Plan: Patient agrees to discharge.  Patient goals were partially met. Patient is being discharged due to the patient's request.  ?????    Gustavus Bryant, PT 01/08/20 9:30 AM

## 2019-11-02 ENCOUNTER — Ambulatory Visit: Payer: Medicaid Other | Admitting: Physical Therapy

## 2019-11-10 ENCOUNTER — Ambulatory Visit: Payer: Medicaid Other | Admitting: Physical Therapy

## 2019-11-17 ENCOUNTER — Ambulatory Visit: Payer: Medicaid Other | Admitting: Physical Therapy

## 2019-12-08 ENCOUNTER — Ambulatory Visit: Payer: Medicaid Other | Attending: Obstetrics and Gynecology | Admitting: Physical Therapy

## 2019-12-13 ENCOUNTER — Encounter: Payer: Medicaid Other | Admitting: Physical Therapy

## 2019-12-22 ENCOUNTER — Encounter: Payer: Medicaid Other | Admitting: Physical Therapy

## 2020-01-05 ENCOUNTER — Encounter: Payer: Medicaid Other | Admitting: Physical Therapy

## 2020-01-30 ENCOUNTER — Other Ambulatory Visit: Payer: Self-pay

## 2020-01-30 ENCOUNTER — Encounter: Payer: Self-pay | Admitting: Allergy

## 2020-01-30 ENCOUNTER — Ambulatory Visit (INDEPENDENT_AMBULATORY_CARE_PROVIDER_SITE_OTHER): Payer: Medicaid Other | Admitting: Allergy

## 2020-01-30 VITALS — BP 110/66 | HR 67 | Resp 17 | Ht 63.5 in | Wt 153.0 lb

## 2020-01-30 DIAGNOSIS — T781XXD Other adverse food reactions, not elsewhere classified, subsequent encounter: Secondary | ICD-10-CM

## 2020-01-30 DIAGNOSIS — T781XXA Other adverse food reactions, not elsewhere classified, initial encounter: Secondary | ICD-10-CM | POA: Insufficient documentation

## 2020-01-30 HISTORY — DX: Other adverse food reactions, not elsewhere classified, initial encounter: T78.1XXA

## 2020-01-30 NOTE — Progress Notes (Signed)
New Patient Note  RE: Betty Mcintosh MRN: [id number] DOB: 20-Dec-2000 Date of Office Visit: 01/30/2020  Referring provider: Vertell Novak* Primary care provider: Ninfa Meeker, FNP  Chief Complaint: Allergic Reaction  History of Present Illness: I had the pleasure of seeing Betty Mcintosh for initial evaluation at the [facility] on 01/30/2020. She is a 19 y.o. female, who is referred here by Ninfa Meeker, FNP for the evaluation of allergic reaction.  She reports food allergy to possibly dairy. The reaction occurred about 4 weeks ago, after she ate homemade hamburger helper with cheese (2 helpings) and then had a chocolate milkshake. Symptoms started within 30 minutes and was in the form of coughing, throat numbness. Denies any hives, swelling, wheezing, abdominal pain, diarrhea, vomiting. Denies any associated cofactors such as exertion, infection, NSAID use, or alcohol consumption. The symptoms lasted for 3 hours after taking benadryl. She was not evaluated in ED. Since this episode, she had a little bit of butter in a recipe the next day and had similar symptoms as above. She does not have access to epinephrine autoinjector. Patient had eaten the above foods with no issues but usually limits her dairy intake as she had some sensitivity to dairy in the form of bloating, diarrhea and nausea in high school.  Eggs cause nausea, headaches, diarrhea. Had hamburger last night with no issues.  No recent tick bites.   Past work up includes: none. Dietary History: patient has been eating other foods including very limited eggs, peanut, treenuts, sesame, shellfish, fish, soy, wheat, meats, fruits and vegetables.  She reports reading labels and avoiding dairy and eggs in diet completely.   Patient had COVID-19 in September and the above reaction occurred afterwards.   Assessment and Plan: Betty Mcintosh is a 19 y.o. female with: Adverse food reaction Developed  coughing and throat numbness after eating chocolate milkshake after having hamburger helper with cheese. Normally does not consume this much dairy in one day. The following day had similar symptoms with butter exposure. History of dairy intolerance with GI issues as a teen. Eggs cause nausea, headaches and diarrhea. Had beef since then with no issues. No recent tick bites. Had COVID-19 a few weeks before the above incident.  Today's skin testing showed: Negative to select foods including milk, casein, egg, chicken, beef, onion, chocolate and garlic.  Discussed with patient concern for dairy and less likely for alpha gal allergy given clinical history.   Food allergen skin testing has excellent negative predictive value however there is still a small chance that the allergy exists. Therefore, we will investigate further with serum specific IgE levels and, if negative then schedule for open graded oral food challenge.  A laboratory order form has been provided for serum specific IgE against milk, egg and alpha-gal.  Continue to avoid milk and eggs. Okay to eat all other foods without restrictions.   Limit red meat intake for now.  Patient declines epinephrine prescription but is willing to get if bloodwork is positive.   For mild symptoms you can take over the counter antihistamines such as Benadryl and monitor symptoms closely. If symptoms worsen or if you have severe symptoms including breathing issues, throat closure, significant swelling, whole body hives, severe diarrhea and vomiting, lightheadedness then seek immediate medical care.  Emergency action plan given.  Return if symptoms worsen or fail to improve.  Lab Orders     Alpha-Gal Panel     IgE Milk w/ Component  Reflex     Allergen Egg White  Other allergy screening: Asthma: no Rhino conjunctivitis: yes Mild rhinitis symptoms from summer to fall and does not take medications for this.  Medication allergy: no Hymenoptera  allergy: no Urticaria: no Eczema:yes as a child.  History of recurrent infections suggestive of immunodeficency: no  Diagnostics: Skin Testing: Select foods. Negative to select foods including milk, casein, egg, chicken, beef, onion, chocolate and garlic.  Results discussed with patient/family.  Food Adult Perc - 01/30/20 1500    Time Antigen Placed 0254    Allergen Manufacturer Waynette Buttery    Location Arm    Number of allergen test 10     Control-buffer 50% Glycerol Negative    Control-Histamine 1 mg/ml 2+    5. Milk, cow Negative    6. Egg White, Chicken Negative    7. Casein Negative    39. Chicken Meat Negative    40. Beef Negative    49. Onion Negative    64. Chocolate/Cacao bean Negative    70. Garlic Negative           Past Medical History: Patient Active Problem List   Diagnosis Date Noted  . Adverse food reaction 01/30/2020  . SVD (spontaneous vaginal delivery) 10/22/2018  . Post-dates pregnancy 10/21/2018  . Supervision of normal first pregnancy 09/23/2018  . Low weight gain during pregnancy in third trimester 08/11/2018   Past Medical History:  Diagnosis Date  . Angio-edema   . Anxiety   . Eczema    Past Surgical History: Past Surgical History:  Procedure Laterality Date  . NO PAST SURGERIES     Medication List:  Current Outpatient Medications  Medication Sig Dispense Refill  . conjugated estrogens (PREMARIN) vaginal cream Place 1 Applicatorful vaginally daily. 42.5 g 12  . ELDERBERRY PO Take 1 tablet by mouth daily.    . Pediatric Multiple Vit-Vit C (VITAMIN DAILY PO) Take 6,000 Int'l Units/day by mouth daily.    . Prenatal Vit-Fe Fumarate-FA (MULTIVITAMIN-PRENATAL) 27-0.8 MG TABS tablet Take 1 tablet by mouth daily at 12 noon.    . Probiotic Product (PROBIOTIC DAILY PO) Take 1 tablet by mouth daily.     No current facility-administered medications for this visit.   Allergies: Allergies  Allergen Reactions  . Lactose Intolerance (Gi) Other (See  Comments)    Migraine and acid reflux   Social History: Social History   Socioeconomic History  . Marital status: Married    Spouse name: Not on file  . Number of children: 1  . Years of education: Not on file  . Highest education level: Not on file  Occupational History  . Not on file  Tobacco Use  . Smoking status: Never Smoker  . Smokeless tobacco: Never Used  Vaping Use  . Vaping Use: Never used  Substance and Sexual Activity  . Alcohol use: Never  . Drug use: Never  . Sexual activity: Yes    Birth control/protection: None  Other Topics Concern  . Not on file  Social History Narrative  . Not on file   Social Determinants of Health   Financial Resource Strain:   . Difficulty of Paying Living Expenses: Not on file  Food Insecurity:   . Worried About Programme researcher, broadcasting/film/video in the Last Year: Not on file  . Ran Out of Food in the Last Year: Not on file  Transportation Needs:   . Lack of Transportation (Medical): Not on file  . Lack of Transportation (Non-Medical): Not on  file  Physical Activity:   . Days of Exercise per Week: Not on file  . Minutes of Exercise per Session: Not on file  Stress:   . Feeling of Stress : Not on file  Social Connections:   . Frequency of Communication with Friends and Family: Not on file  . Frequency of Social Gatherings with Friends and Family: Not on file  . Attends Religious Services: Not on file  . Active Member of Clubs or Organizations: Not on file  . Attends Banker Meetings: Not on file  . Marital Status: Not on file   Lives in a [address] year old home. Smoking: denies Occupation: doula  Environmental HistorySurveyor, minerals in the house: no Carpet in the family room: no Carpet in the bedroom: yes Heating: electric Cooling: central Pet: no  Family History: Family History  Problem Relation Age of Onset  . Hypertension Mother   . Eczema Mother   . Angioedema Mother   . Eczema Father   . Eczema Sister     . Angioedema Maternal Grandfather   . Breast cancer Neg Hx   . Colon cancer Neg Hx   . Ovarian cancer Neg Hx    Review of Systems  Constitutional: Negative for appetite change, chills, fever and unexpected weight change.  HENT: Negative for congestion and rhinorrhea.   Eyes: Negative for itching.  Respiratory: Negative for cough, chest tightness, shortness of breath and wheezing.   Cardiovascular: Negative for chest pain.  Gastrointestinal: Negative for abdominal pain.  Genitourinary: Negative for difficulty urinating.  Skin: Negative for rash.  Neurological: Negative for headaches.   Objective: BP 110/66   Pulse 67   Resp 17   Ht 5' 3.5" (1.613 m)   Wt 153 lb (69.4 kg)   SpO2 97%   BMI 26.68 kg/m  Body mass index is 26.68 kg/m. Physical Exam Vitals and nursing note reviewed.  Constitutional:      Appearance: Normal appearance. She is well-developed.  HENT:     Head: Normocephalic and atraumatic.     Right Ear: External ear normal.     Left Ear: External ear normal.     Nose: Nose normal.     Mouth/Throat:     Mouth: Mucous membranes are moist.     Pharynx: Oropharynx is clear.  Eyes:     Conjunctiva/sclera: Conjunctivae normal.  Cardiovascular:     Rate and Rhythm: Normal rate and regular rhythm.     Heart sounds: Normal heart sounds. No murmur heard.  No friction rub. No gallop.   Pulmonary:     Effort: Pulmonary effort is normal.     Breath sounds: Normal breath sounds. No wheezing, rhonchi or rales.  Abdominal:     Palpations: Abdomen is soft.  Musculoskeletal:     Cervical back: Neck supple.  Skin:    General: Skin is warm.     Findings: No rash.  Neurological:     Mental Status: She is alert and oriented to person, place, and time.  Psychiatric:        Behavior: Behavior normal.    The plan was reviewed with the patient/family, and all questions/concerned were addressed.  It was my pleasure to see Betty Mcintosh today and participate in her care. Please  feel free to contact me with any questions or concerns.  Sincerely,  Wyline Mood, DO Allergy & Immunology  [facility] office: [phone] [facility] office: [phone]

## 2020-01-30 NOTE — Patient Instructions (Addendum)
Today's skin testing showed: Negative to select foods including milk, casein, egg, chicken, beef, onion, chocolate and garlic.   Food allergen skin testing has excellent negative predictive value however there is still a small chance that the allergy exists. Therefore, we will investigate further with serum specific IgE levels and, if negative then schedule for open graded oral food challenge.  A laboratory order form has been provided for serum specific IgE against milk, egg and alpha-gal.  Continue to avoid milk and eggs.  Limit red meat intake for now.  If bloodwork shows positive results then will send in an Epipen.   For mild symptoms you can take over the counter antihistamines such as Benadryl and monitor symptoms closely. If symptoms worsen or if you have severe symptoms including breathing issues, throat closure, significant swelling, whole body hives, severe diarrhea and vomiting, lightheadedness then seek immediate medical care.  Follow up as needed depending on the bloodwork results.

## 2020-01-30 NOTE — Assessment & Plan Note (Addendum)
Developed coughing and throat numbness after eating chocolate milkshake after having hamburger helper with cheese. Normally does not consume this much dairy in one day. The following day had similar symptoms with butter exposure. History of dairy intolerance with GI issues as a teen. Eggs cause nausea, headaches and diarrhea. Had beef since then with no issues. No recent tick bites. Had COVID-19 a few weeks before the above incident.  Today's skin testing showed: Negative to select foods including milk, casein, egg, chicken, beef, onion, chocolate and garlic.  Discussed with patient concern for dairy and less likely for alpha gal allergy given clinical history.   Food allergen skin testing has excellent negative predictive value however there is still a small chance that the allergy exists. Therefore, we will investigate further with serum specific IgE levels and, if negative then schedule for open graded oral food challenge.  A laboratory order form has been provided for serum specific IgE against milk, egg and alpha-gal.  Continue to avoid milk and eggs. Okay to eat all other foods without restrictions.   Limit red meat intake for now.  Patient declines epinephrine prescription but is willing to get if bloodwork is positive.   For mild symptoms you can take over the counter antihistamines such as Benadryl and monitor symptoms closely. If symptoms worsen or if you have severe symptoms including breathing issues, throat closure, significant swelling, whole body hives, severe diarrhea and vomiting, lightheadedness then seek immediate medical care.  Emergency action plan given.

## 2020-02-07 LAB — IGE MILK W/ COMPONENT REFLEX: F002-IgE Milk: <0.1 kU/L

## 2020-02-07 LAB — ALPHA-GAL PANEL
Alpha Gal IgE*: <0.1 kU/L (ref <0.10)
Beef (Bos spp) IgE: <0.1 kU/L (ref <0.35)
Class Interpretation: 0
Class Interpretation: 0
Class Interpretation: 0
Lamb/Mutton (Ovis spp) IgE: <0.1 kU/L (ref <0.35)
Pork (Sus spp) IgE: <0.1 kU/L (ref <0.35)

## 2020-02-07 LAB — IGE EGG WHITE W/COMPONENT RFLX: F001-IgE Egg White: <0.1 kU/L

## 2022-08-27 ENCOUNTER — Emergency Department (HOSPITAL_BASED_OUTPATIENT_CLINIC_OR_DEPARTMENT_OTHER)
Admission: EM | Admit: 2022-08-27 | Discharge: 2022-08-27 | Disposition: A | Discharge status: Home / Self Care | Payer: Medicaid Other | Attending: Emergency Medicine | Admitting: Emergency Medicine

## 2022-08-27 ENCOUNTER — Encounter (HOSPITAL_BASED_OUTPATIENT_CLINIC_OR_DEPARTMENT_OTHER): Payer: Self-pay | Admitting: Urology

## 2022-08-27 ENCOUNTER — Other Ambulatory Visit: Payer: Self-pay

## 2022-08-27 DIAGNOSIS — R197 Diarrhea, unspecified: Secondary | ICD-10-CM | POA: Insufficient documentation

## 2022-08-27 LAB — CBC WITH DIFFERENTIAL/PLATELET
Abs Immature Granulocytes: 0.04 10*3/uL (ref 0.00–0.07)
Basophils Absolute: 0 10*3/uL (ref 0.0–0.1)
Basophils Relative: 0 %
Eosinophils Absolute: 0 10*3/uL (ref 0.0–0.5)
Eosinophils Relative: 0 %
HCT: 42.2 % (ref 36.0–46.0)
Hemoglobin: 14.3 g/dL (ref 12.0–15.0)
Immature Granulocytes: 0 %
Lymphocytes Relative: 20 %
Lymphs Abs: 2.2 10*3/uL (ref 0.7–4.0)
MCH: 31 pg (ref 26.0–34.0)
MCHC: 33.9 g/dL (ref 30.0–36.0)
MCV: 91.5 fL (ref 80.0–100.0)
Monocytes Absolute: 0.4 10*3/uL (ref 0.1–1.0)
Monocytes Relative: 4 %
Neutro Abs: 8.4 10*3/uL — ABNORMAL HIGH (ref 1.7–7.7)
Neutrophils Relative %: 76 %
Platelets: 235 10*3/uL (ref 150–400)
RBC: 4.61 MIL/uL (ref 3.87–5.11)
RDW: 11.6 % (ref 11.5–15.5)
WBC: 11.1 10*3/uL — ABNORMAL HIGH (ref 4.0–10.5)
nRBC: 0 % (ref 0.0–0.2)

## 2022-08-27 LAB — COMPREHENSIVE METABOLIC PANEL
ALT: 25 U/L (ref 0–44)
AST: 24 U/L (ref 15–41)
Albumin: 4.5 g/dL (ref 3.5–5.0)
Alkaline Phosphatase: 99 U/L (ref 38–126)
Anion gap: 11 (ref 5–15)
BUN: 15 mg/dL (ref 6–20)
CO2: 23 mmol/L (ref 22–32)
Calcium: 9.3 mg/dL (ref 8.9–10.3)
Chloride: 102 mmol/L (ref 98–111)
Creatinine, Ser: 0.83 mg/dL (ref 0.44–1.00)
GFR, Estimated: >60 mL/min (ref >60)
Glucose, Bld: 98 mg/dL (ref 70–99)
Potassium: 4 mmol/L (ref 3.5–5.1)
Sodium: 136 mmol/L (ref 135–145)
Total Bilirubin: 0.3 mg/dL (ref 0.3–1.2)
Total Protein: 7.8 g/dL (ref 6.5–8.1)

## 2022-08-27 NOTE — ED Provider Notes (Signed)
[facility] Provider Note   CSN: [id number] Arrival date & time: 08/27/22  1839     History  Chief Complaint  Patient presents with   Worms in stool     Betty Mcintosh is a 22 y.o. female.  Patient has had some stomach issues for about 6 weeks this has been diarrhea recently.  Concerned about passing parasites.  Their well water is become contaminated husband not really eliciting any significant symptoms.  May be a little bit of abdominal discomfort.  Patient went to urgent care today and they sent her here.  Patient not having any blood in her stools.  Patient not having a significant amount of diarrhea.  Only a few a day no nausea no vomiting.  Stools are watery.  Patient has appoint with her new primary care doctor on June 25.  Patient is not followed by gastroenterology.  Patient denies any fevers denies pain jaundice.  Denies any blood in the bowel movements.  No history of anything similar.    Past medical history significant for eczema.       Home Medications Prior to Admission medications   Medication Sig Start Date End Date Taking? Authorizing Provider  conjugated estrogens (PREMARIN) vaginal cream Place 1 Applicatorful vaginally daily. 08/02/19   Raelyn Mora, CNM  ELDERBERRY PO Take 1 tablet by mouth daily.    [provider]  Pediatric Multiple Vit-Vit C (VITAMIN DAILY PO) Take 6,000 Int'l Units/day by mouth daily.    [provider]  Prenatal Vit-Fe Fumarate-FA (MULTIVITAMIN-PRENATAL) 27-0.8 MG TABS tablet Take 1 tablet by mouth daily at 12 noon.    [provider]  Probiotic Product (PROBIOTIC DAILY PO) Take 1 tablet by mouth daily.    [provider]      Allergies    Lactose intolerance (gi)    Review of Systems   Review of Systems  Constitutional:  Negative for chills and fever.  HENT:  Negative for ear pain and sore throat.   Eyes:  Negative for pain and visual disturbance.   Respiratory:  Negative for cough and shortness of breath.   Cardiovascular:  Negative for chest pain and palpitations.  Gastrointestinal:  Positive for abdominal pain and diarrhea. Negative for blood in stool and vomiting.  Genitourinary:  Negative for dysuria and hematuria.  Musculoskeletal:  Negative for arthralgias and back pain.  Skin:  Negative for color change and rash.  Neurological:  Negative for seizures and syncope.  All other systems reviewed and are negative.   Physical Exam Updated Vital Signs BP 127/84 (BP Location: Left Arm)   Pulse 94   Temp 98.2 F (36.8 C) (Oral)   Resp 20   Ht 1.626 m (5\' 4" )   Wt 69.4 kg   SpO2 100%   BMI 26.26 kg/m  Physical Exam Vitals and nursing note reviewed.  Constitutional:      General: She is not in acute distress.    Appearance: Normal appearance. She is well-developed. She is not ill-appearing.  HENT:     Head: Normocephalic and atraumatic.  Eyes:     General: No scleral icterus.    Extraocular Movements: Extraocular movements intact.     Conjunctiva/sclera: Conjunctivae normal.     Pupils: Pupils are equal, round, and reactive to light.  Cardiovascular:     Rate and Rhythm: Normal rate and regular rhythm.     Heart sounds: No murmur heard. Pulmonary:     Effort: Pulmonary effort  is normal. No respiratory distress.     Breath sounds: Normal breath sounds.  Abdominal:     General: There is no distension.     Palpations: Abdomen is soft.     Tenderness: There is no abdominal tenderness. There is no guarding.  Musculoskeletal:        General: No swelling.     Cervical back: Normal range of motion and neck supple.  Skin:    General: Skin is warm and dry.     Capillary Refill: Capillary refill takes less than 2 seconds.     Findings: No rash.  Neurological:     General: No focal deficit present.     Mental Status: She is alert and oriented to person, place, and time.  Psychiatric:        Mood and Affect: Mood normal.      ED Results / Procedures / Treatments   Labs (all labs ordered are listed, but only abnormal results are displayed) Labs Reviewed - No data to display  EKG None  Radiology No results found.  Procedures Procedures    Medications Ordered in ED Medications - No data to display  ED Course/ Medical Decision Making/ A&P                             Medical Decision Making Amount and/or Complexity of Data Reviewed Labs: ordered.   I recommended CT scan for evaluation of the diarrhea.  Patient did not want that.  She agreed to labs.  We tried to collect ova and parasites here.  Did PCR for GI panel.  But patient unable to provide stool sample.  CBC white count 11.1 hemoglobin 14.3 platelets 235.  Patient is not febrile temp here 98.2 pulse 94 blood pressure 124/84 oxygen saturations at 100%.  Complete metabolic panel is completely normal including liver function test which is very reassuring.  Renal function is very normal as well.  Will give patient referral to GI medicine for further evaluation.  Patient also has follow-up with her primary care doctor on June 24.  Patient nontoxic no acute distress. Final Clinical Impression(s) / ED Diagnoses Final diagnoses:  None    Rx / DC Orders ED Discharge Orders     None         Vanetta Mulders, MD 08/27/22 [phone]

## 2022-08-27 NOTE — Discharge Instructions (Signed)
Keep your appointment with your new primary care doctor.  Collection of stool ova and parasite would be helpful.  PCR testing ordered as well.  But not able to provide a stool sample here tonight unfortunately.  Complete metabolic panel liver function test without concerning findings.  If symptoms persist CT scan of abdomen and pelvis would be recommended.  Return for any new or worse symptoms.  Also given referral to gastroenterology on-call call and set up an appointment.

## 2022-08-27 NOTE — ED Triage Notes (Signed)
Pt states diarrhea and "stomach issues" x 6 weeks  States feels she "passed parasites" was seen at [facility] and sent here  Have appointment with pcp on 6/24  Watery stools since 1400 and worms noted in stool

## 2022-09-21 ENCOUNTER — Encounter: Payer: Self-pay | Admitting: Cardiology

## 2022-10-13 NOTE — Progress Notes (Addendum)
Cardiology Office Note:    Date:  10/14/2022   ID:  Betty Mcintosh, DOB 05-18-2000, MRN [id number]  PCP:  Ninfa Meeker, FNP (Inactive)  Cardiologist:  Norman Herrlich, MD   Referring MD: Abner Greenspan, MD  ASSESSMENT:    1. Palpitations   2. Dysautonomia (HCC)   3. Inappropriate sinus tachycardia   4. APC (atrial premature contractions)    PLAN:    In order of problems listed above:  Clearly she is having ongoing symptoms of becoming disabling the hallmark of which is dizziness weakness also feeling that her heart is racing and she captures rapid heart rates up to 190 beats on her Apple Watch.  She does not have POTS. Differential diagnosis includes arrhythmia and she does have APCs and repetitive APCs on her EKG versus dysautonomia with inappropriate sinus tachycardia To mitigate symptoms asked her to take 2 salt tablets a day and surely fluid 3 to 4 L/day continue use thigh-high support hose and use an abdominal binder I do think she needs some cardiology evaluation I am going to have her do an echocardiogram to exclude any structural heart disease and a 1 week event monitor life to capture these episodes I suspect cardiac studies will be unremarkable and I have reviewed with her I think she is best served by being referred to [facility] they have a multidisciplinary dysautonomia and syncope clinic and I think they would be able to give her precise diagnosis prognosis and help her to manage the symptoms so she can continue to live her life She is hopeful at the end of the visit Follow-up with me 4 weeks   Medication Adjustments/Labs and Tests Ordered: Current medicines are reviewed at length with the patient today.  Concerns regarding medicines are outlined above.  Orders Placed This Encounter  Procedures   LONG TERM MONITOR-LIVE TELEMETRY (3-14 DAYS)   EKG 12-Lead   ECHOCARDIOGRAM COMPLETE   No orders of the defined types were placed in this encounter.     History of Present  Illness:    Betty Mcintosh is a 22 y.o. female who is being seen today for the evaluation of palpitation she was seen with her primary care physician 09/07/2022 with concerns of palpitation reported heart rate in the range of 140 bpm.  She is also concerned about POTS.  Her physician was more concerned about anxiety but did refer her for access to an event recorder.  She is seen today at the request of Abner Greenspan, MD.  In adults, the primary manifestation of POTS is an increase in heart rate of more than 30 beats per minute within ten minutes of standing up.[1][33] The resulting heart rate is typically more than 120 beats per minute.[1] For people between ages 83 and 25, the minimum increase for a POTS diagnosis is 40 beats per minute.[34] POTS is often accompanied by common features of orthostatic intolerance--in which symptoms that develop while upright are relieved by reclining.[33] These orthostatic symptoms include palpitations, light-headedness, chest discomfort, shortness of breath,[33] nausea, weakness or "heaviness" in the lower legs, blurred vision, and cognitive difficulties.[1] Symptoms may be exacerbated with prolonged sitting, prolonged standing, alcohol, heat, exercise, or eating a large meal.[35]  For 7 years she has had recurrent symptoms. Initially very severe persistent now they are variable and seems to have a cyclic hormonal rotation. The hallmark is dizziness difficulty with focus concentration quite weak and she describes her dizziness as more near syncope than disequilibrium or vertigo Recently had a severe episode  and actually had vertigo with that She has no history of migraine headache head trauma and she has not lost consciousness She has a smart watch and she is captured heart rates up to 190 bpm during episodes She has had COVID on several occasions but not severe and the symptoms preceded and are unchanged afterwards She takes no over-the-counter medications except  Claritin She has had no spine or CNS trauma. She is concerned she may have POTS  Past Medical History:  Diagnosis Date   Angio-edema    Anxiety    Eczema    Palpitation     Past Surgical History:  Procedure Laterality Date   NO PAST SURGERIES      Current Medications: Current Meds  Medication Sig   ferrous sulfate 325 (65 FE) MG EC tablet Take 325 mg by mouth daily with breakfast.   loratadine (CLARITIN) 10 MG tablet Take 10 mg by mouth daily.   Multiple Vitamin (QUINTABS) TABS Take 1 tablet by mouth daily.     Allergies:   Lactose intolerance (gi)   Social History   Socioeconomic History   Marital status: Married    Spouse name: Not on file   Number of children: 1   Years of education: Not on file   Highest education level: Not on file  Occupational History   Not on file  Tobacco Use   Smoking status: Never   Smokeless tobacco: Never  Vaping Use   Vaping status: Never Used  Substance and Sexual Activity   Alcohol use: Never   Drug use: Never   Sexual activity: Yes    Birth control/protection: None  Other Topics Concern   Not on file  Social History Narrative   Not on file   Social Determinants of Health   Financial Resource Strain: Low Risk  (10/12/2018)   Overall Financial Resource Strain (CARDIA)    Difficulty of Paying Living Expenses: Not hard at all  Food Insecurity: No Food Insecurity (10/12/2018)   Hunger Vital Sign    Worried About Running Out of Food in the Last Year: Never true    Ran Out of Food in the Last Year: Never true  Transportation Needs: Unknown (10/12/2018)   PRAPARE - Administrator, Civil Service (Medical): No    Lack of Transportation (Non-Medical): Not on file  Physical Activity: Not on file  Stress: No Stress Concern Present (10/12/2018)   [employer] of Occupational Health - Occupational Stress Questionnaire    Feeling of Stress : Only a little  Social Connections: Not on file     Family History: The  patient's family history includes Angioedema in her maternal grandfather and mother; Eczema in her father, mother, and sister; Hypertension in her mother. There is no history of Breast cancer, Colon cancer, or Ovarian cancer.  ROS:   ROS Please see the history of present illness.     All other systems reviewed and are negative.  EKGs/Labs/Other Studies Reviewed:    The following studies were reviewed today:  EKG Interpretation Date/Time:  Wednesday October 14 2022 15:32:36 EDT Ventricular Rate:  87 PR Interval:  128 QRS Duration:  82 QT Interval:  346 QTC Calculation: 416 R Axis:   79  Text Interpretation: Sinus rhythm with Premature atrial complexes No previous ECGs available Confirmed by Norman Herrlich ([id number]) on 10/14/2022 4:15:43 PM  EKG Interpretation Date/Time:  Wednesday October 14 2022 15:32:36 EDT Ventricular Rate:  87 PR Interval:  128 QRS Duration:  82 QT  Interval:  346 QTC Calculation: 416 R Axis:   79  Text Interpretation: Sinus rhythm with Premature atrial complexes No previous ECGs available Confirmed by Norman Herrlich ([id number]) on 10/14/2022 4:15:43 PM    Recent Labs: Her recent laboratory test 09/11/2022 included a CBC with a hemoglobin of 13.9 MCV is normal at 92.  Her CMP is normal except for a random glucose of 113 sodium 140 potassium 4.3 TSH normal 2.2.  Physical Exam:    VS:  BP 128/88   Pulse 87   Ht 5\' 4"  (1.626 m)   Wt 208 lb 9.6 oz (94.6 kg)   SpO2 99%   BMI 35.81 kg/m     Wt Readings from Last 3 Encounters:  10/14/22 208 lb 9.6 oz (94.6 kg)  09/07/22 210 lb (95.3 kg)  08/27/22 153 lb (69.4 kg)    Orthostatic heart rate supine 87 standing at 1 minute maximum 94 bpm GEN: Obese well nourished, well developed in no acute distress HEENT: Normal NECK: No JVD; No carotid bruits LYMPHATICS: No lymphadenopathy CARDIAC: RRR, no murmurs, rubs, gallops RESPIRATORY:  Clear to auscultation without rales, wheezing or rhonchi  ABDOMEN: Soft, non-tender,  non-distended MUSCULOSKELETAL:  No edema; No deformity  SKIN: Warm and dry NEUROLOGIC:  Alert and oriented x 3 PSYCHIATRIC:  Normal affect     Signed, Norman Herrlich, MD  10/14/2022 4:16 PM    [facility]

## 2022-10-14 ENCOUNTER — Encounter: Payer: Self-pay | Admitting: Cardiology

## 2022-10-14 ENCOUNTER — Ambulatory Visit: Payer: Medicaid Other | Attending: Cardiology

## 2022-10-14 ENCOUNTER — Ambulatory Visit: Payer: Medicaid Other | Attending: Cardiology | Admitting: Cardiology

## 2022-10-14 VITALS — BP 128/88 | HR 87 | Ht 64.0 in | Wt 208.6 lb

## 2022-10-14 DIAGNOSIS — I491 Atrial premature depolarization: Secondary | ICD-10-CM

## 2022-10-14 DIAGNOSIS — G901 Familial dysautonomia [Riley-Day]: Secondary | ICD-10-CM

## 2022-10-14 DIAGNOSIS — I4711 Inappropriate sinus tachycardia, so stated: Secondary | ICD-10-CM | POA: Diagnosis not present

## 2022-10-14 DIAGNOSIS — R002 Palpitations: Secondary | ICD-10-CM

## 2022-10-14 NOTE — Patient Instructions (Signed)
Medication Instructions:  Your physician recommends that you continue on your current medications as directed. Please refer to the Current Medication list given to you today.  *If you need a refill on your cardiac medications before your next appointment, please call your pharmacy*   Lab Work: None If you have labs (blood work) drawn today and your tests are completely normal, you will receive your results only by: MyChart Message (if you have MyChart) OR A paper copy in the mail If you have any lab test that is abnormal or we need to change your treatment, we will call you to review the results.   Testing/Procedures: Your physician has requested that you have an echocardiogram. Echocardiography is a painless test that uses sound waves to create images of your heart. It provides your doctor with information about the size and shape of your heart and how well your heart's chambers and valves are working. This procedure takes approximately one hour. There are no restrictions for this procedure. Please do NOT wear cologne, perfume, aftershave, or lotions (deodorant is allowed). Please arrive 15 minutes prior to your appointment time.  A zio monitor was ordered today. It will remain on for 7 days. You will then return monitor and event diary in provided box. It takes 1-2 weeks for report to be downloaded and returned to [place]. We will call you with the results. If monitor falls off or has orange flashing light, please call Zio for further instructions.     Follow-Up: At [facility], you and your health needs are our priority.  As part of our continuing mission to provide you with exceptional heart care, we have created designated Provider Care Teams.  These Care Teams include your primary Cardiologist (physician) and Advanced Practice Providers (APPs -  Physician Assistants and Nurse Practitioners) who all work together to provide you with the care you need, when you need it.  We  recommend signing up for the patient portal called "MyChart".  Sign up information is provided on this After Visit Summary.  MyChart is used to connect with patients for Virtual Visits (Telemedicine).  Patients are able to view lab/test results, encounter notes, upcoming appointments, etc.  Non-urgent messages can be sent to your provider as well.   To learn more about what you can do with MyChart, go to [email or web address].    Your next appointment:   6 week(s)  Provider:   Norman Herrlich, MD    Other Instructions Salt tablets 2 per day and 3 - 4 liters of fluid per day  Thigh high support hose  and an abdominal binder

## 2022-10-15 ENCOUNTER — Encounter: Payer: Self-pay | Admitting: Cardiology

## 2022-10-16 ENCOUNTER — Telehealth: Payer: Self-pay | Admitting: Cardiology

## 2022-10-16 NOTE — Telephone Encounter (Signed)
Pt c/o medication issue:  1. Name of Medication:   Salt Chews  2. How are you currently taking this medication (dosage and times per day)?   3. Are you having a reaction (difficulty breathing--STAT)?   4. What is your medication issue?   Patient wants Dr. Dulce Sellar to prescribe her salt chews as she has difficulty swallowing and it could be covered under her insurance.

## 2022-10-20 ENCOUNTER — Other Ambulatory Visit: Payer: Self-pay

## 2022-10-20 MED ORDER — SODIUM CHLORIDE 1 G PO TABS: 1.0000 g | ORAL_TABLET | Freq: Every day | ORAL | 3 refills | Status: AC

## 2022-10-20 NOTE — Telephone Encounter (Signed)
Dr. Bing Matter has ordered for the patient to be prescribed "salt chews" but he is unsure how to prescribe it.  Can you help with the prescription for this.

## 2022-10-20 NOTE — Telephone Encounter (Signed)
Called patient and informed her of the pharmacist recommendation below:  "I don't see that salt chews exist. There is a salt tablet, it looks like she has trouble swallowing it, would see if she can cut it in smaller pieces. The tablet is listed in epic under sodium chloride tablet 1 gram."   Patient verbalized understanding and agreed to getting a prescription for the sodium chloride tablet 1 gram. Patient had no further questions at this time.

## 2022-10-20 NOTE — Telephone Encounter (Signed)
I don't see that salt chews exist. There is a salt tablet, it looks like she has trouble swallowing it, would see if she can cut it in smaller pieces. The tablet is listed in epic under sodium chloride tablet 1 gram.

## 2022-10-26 ENCOUNTER — Telehealth: Payer: Self-pay | Admitting: Cardiology

## 2022-10-26 NOTE — Telephone Encounter (Signed)
New Message:     Patient says she needs a note to be excused from jury duty on 11-09-22 please.

## 2022-10-27 NOTE — Telephone Encounter (Signed)
Jury duty letter left at the front desk for the patient to pick up.

## 2022-10-27 NOTE — Telephone Encounter (Signed)
Attempted to call patient. Patient did not answer the phone and voice mail full so unable to leave a message.

## 2022-10-28 NOTE — Telephone Encounter (Signed)
Called patient and informed her that Dr. Dulce Sellar had agreed to write a letter so that she would not have to attend jury duty and it was at the front desk for her to pick up at her convience. Patient verbalized understanding and had no further questions at this time.

## 2022-11-04 ENCOUNTER — Other Ambulatory Visit: Payer: Self-pay

## 2022-11-04 MED ORDER — ACEBUTOLOL HCL 200 MG PO CAPS: 200.0000 mg | ORAL_CAPSULE | Freq: Every day | ORAL | 3 refills | Status: DC

## 2022-11-10 ENCOUNTER — Telehealth: Payer: Self-pay

## 2022-11-10 ENCOUNTER — Other Ambulatory Visit (HOSPITAL_COMMUNITY): Payer: Self-pay

## 2022-11-10 NOTE — Telephone Encounter (Signed)
Pharmacy Patient Advocate Encounter   Received notification from CoverMyMeds that prior authorization for ACEBUTOLOL is required/requested.   Insurance verification completed.   The patient is insured through [facility] [place] .   Per test claim: PA required; PA submitted to [facility] Medicaid via CoverMyMeds Key/confirmation #/EOC [id number] Status is pending

## 2022-11-10 NOTE — Telephone Encounter (Signed)
Pharmacy Patient Advocate Encounter  Received notification from [facility] Medicaid that Prior Authorization for ACEBUTOLOL has been DENIED. Please advise how you'd like to proceed. Full denial letter will be uploaded to the media tab. See denial reason below.   HAS TO TRY AND FAIL 2 PREFERRED DRUGS FIRST (ATENOLOL, CARVEDILOL, METOPROLOL)

## 2022-11-11 ENCOUNTER — Ambulatory Visit: Payer: Medicaid Other | Attending: Cardiology

## 2022-11-11 DIAGNOSIS — R002 Palpitations: Secondary | ICD-10-CM | POA: Diagnosis not present

## 2022-11-11 DIAGNOSIS — I491 Atrial premature depolarization: Secondary | ICD-10-CM | POA: Diagnosis not present

## 2022-11-11 DIAGNOSIS — I4711 Inappropriate sinus tachycardia, so stated: Secondary | ICD-10-CM | POA: Diagnosis not present

## 2022-11-11 DIAGNOSIS — G901 Familial dysautonomia [Riley-Day]: Secondary | ICD-10-CM

## 2022-11-11 LAB — ECHOCARDIOGRAM COMPLETE
Area-P 1/2: 3.78 cm2
S' Lateral: 3.3 cm

## 2022-11-12 ENCOUNTER — Telehealth: Payer: Self-pay | Admitting: Cardiology

## 2022-11-12 NOTE — Telephone Encounter (Signed)
A prior authorization was sent in for this medication for the patient and it was denied. Sent a message to Dr. Dulce Sellar to see if there was another medication that he wanted to prescribed for this patient.

## 2022-11-12 NOTE — Telephone Encounter (Signed)
Pt c/o medication issue:  1. Name of Medication:   acebutolol (SECTRAL) 200 MG capsule    2. How are you currently taking this medication (dosage and times per day)?   Take 1 capsule (200 mg total) by mouth daily.    3. Are you having a reaction (difficulty breathing--STAT)? No  4. What is your medication issue? Pt states she can not afford the above medication and would like to have a cheaper alternative. Please advise

## 2022-11-12 NOTE — Telephone Encounter (Signed)
Dr. Dulce Sellar responded to this message on another thread for this patient.

## 2022-11-19 NOTE — Telephone Encounter (Signed)
Attempted to call patient. Patient did not answer the phone. Voice mail was full and unable to leave a voice mail at this time.

## 2022-11-26 ENCOUNTER — Encounter: Payer: Self-pay | Admitting: Cardiology

## 2022-12-02 DIAGNOSIS — T783XXA Angioneurotic edema, initial encounter: Secondary | ICD-10-CM | POA: Insufficient documentation

## 2022-12-02 DIAGNOSIS — F419 Anxiety disorder, unspecified: Secondary | ICD-10-CM | POA: Insufficient documentation

## 2022-12-02 DIAGNOSIS — L309 Dermatitis, unspecified: Secondary | ICD-10-CM | POA: Insufficient documentation

## 2022-12-02 DIAGNOSIS — R002 Palpitations: Secondary | ICD-10-CM | POA: Insufficient documentation

## 2022-12-03 ENCOUNTER — Other Ambulatory Visit: Payer: Self-pay

## 2022-12-03 DIAGNOSIS — G901 Familial dysautonomia [Riley-Day]: Secondary | ICD-10-CM

## 2022-12-03 NOTE — Progress Notes (Signed)
Cardiology Office Note:  .   Date:  12/04/2022  ID:  Betty Mcintosh, DOB 2001/02/03, MRN [id number] PCP: Hadley Pen, MD  [facility] Providers Cardiologist:  None    History of Present Illness: .   Betty Mcintosh is a 22 y.o. female with a past medical history of dysautonomia, angioedema, anxiety, palpitations.  She establish care with Dr. Dulce Sellar on 10/14/2022 for evaluation of dizziness, weakness, heart racing.  She was advised to increase her water intake, use thigh-high support hose as well as an abdominal binder, and take salt tablets daily.  She presents today for follow-up of her dysautonomia.  She seems to be doing somewhat better although she was recently in [place] at [employer] and had an episode where she needed to go to the emergency department, heart rate was staying elevated greater than 180, it was felt dehydration was likely playing a part in this.  She is compliant with supportive measures, she is drinking adequate fluids, she is taking salt tablets, she is wearing thigh-high compression stockings as well as an abdominal binder.  Dr. Dulce Sellar previously referred her to [facility] for the dysautonomia clinic.    ROS: Review of Systems  Constitutional:  Positive for malaise/fatigue.  HENT: Negative.    Eyes: Negative.   Respiratory: Negative.    Cardiovascular:  Positive for palpitations.  Gastrointestinal:        GI distress  Genitourinary: Negative.   Musculoskeletal: Negative.   Skin: Negative.   Neurological:  Positive for dizziness and weakness.  Endo/Heme/Allergies: Negative.   Psychiatric/Behavioral: Negative.       Studies Reviewed: .    Cardiac Studies & Procedures       ECHOCARDIOGRAM  ECHOCARDIOGRAM COMPLETE 11/11/2022  Narrative ECHOCARDIOGRAM REPORT    Patient Name:   Betty Mcintosh Date of Exam: 11/11/2022 Medical Rec #:  [id number]   Height:       64.0 in Accession #:    [id number]  Weight:       208.6 lb Date of Birth:  [birth year]   BSA:           1.992 m Patient Age:    22 years    BP:           128/88 mmHg Patient Gender: F           HR:           81 bpm. Exam Location:  [place]  Procedure: 2D Echo, Cardiac Doppler, Color Doppler and Strain Analysis  Indications:    Palpitations [R00.2 (ICD-10-CM)]; Dysautonomia (HCC) [G90.1 (ICD-10-CM)]; Inappropriate sinus tachycardia [I47.11 (ICD-10-CM)]; APC (atrial premature contractions) [I49.1 (ICD-10-CM)]  History:        Patient has no prior history of Echocardiogram examinations. Arrythmias:Tachycardia.  Sonographer:    Margreta Journey RDCS Referring Phys: [id number] BRIAN J MUNLEY  IMPRESSIONS   1. Left ventricular ejection fraction, by estimation, is 60 to 65%. The left ventricle has normal function. The left ventricle has no regional wall motion abnormalities. Left ventricular diastolic parameters were normal. The average left ventricular global longitudinal strain is -18.0 %. 2. Right ventricular systolic function is normal. The right ventricular size is normal. 3. The mitral valve is grossly normal. No evidence of mitral valve regurgitation. No evidence of mitral stenosis. 4. The aortic valve is tricuspid. Aortic valve regurgitation is not visualized. No aortic stenosis is present. 5. The inferior vena cava is normal in size with greater than 50% respiratory variability, suggesting right atrial pressure of 3 mmHg.  Comparison(s): No prior Echocardiogram.  FINDINGS Left Ventricle: Left ventricular ejection fraction, by estimation, is 60 to 65%. The left ventricle has normal function. The left ventricle has no regional wall motion abnormalities. The average left ventricular global longitudinal strain is -18.0 %. The left ventricular internal cavity size was normal in size. There is no left ventricular hypertrophy. Left ventricular diastolic parameters were normal.  Right Ventricle: The right ventricular size is normal. No increase in right ventricular wall thickness. Right  ventricular systolic function is normal.  Left Atrium: Left atrial size was normal in size.  Right Atrium: Right atrial size was normal in size.  Pericardium: There is no evidence of pericardial effusion.  Mitral Valve: The mitral valve is grossly normal. No evidence of mitral valve regurgitation. No evidence of mitral valve stenosis.  Tricuspid Valve: The tricuspid valve is not well visualized. Tricuspid valve regurgitation is not demonstrated. No evidence of tricuspid stenosis.  Aortic Valve: The aortic valve is tricuspid. Aortic valve regurgitation is not visualized. No aortic stenosis is present.  Pulmonic Valve: The pulmonic valve was not well visualized. Pulmonic valve regurgitation is not visualized. No evidence of pulmonic stenosis.  Aorta: The aortic root is normal in size and structure.  Venous: The inferior vena cava is normal in size with greater than 50% respiratory variability, suggesting right atrial pressure of 3 mmHg.  IAS/Shunts: No atrial level shunt detected by color flow Doppler.   LEFT VENTRICLE PLAX 2D LVIDd:         4.70 cm   Diastology LVIDs:         3.30 cm   LV e' medial:    9.14 cm/s LV PW:         0.80 cm   LV E/e' medial:  11.4 LV IVS:        0.80 cm   LV e' lateral:   14.30 cm/s LVOT diam:     1.90 cm   LV E/e' lateral: 7.3 LV SV:         55 LV SV Index:   28        2D Longitudinal Strain LVOT Area:     2.84 cm  2D Strain GLS Avg:     -18.0 %   RIGHT VENTRICLE             IVC RV Basal diam:  3.40 cm     IVC diam: 1.60 cm RV Mid diam:    3.20 cm RV S prime:     14.40 cm/s TAPSE (M-mode): 2.9 cm  LEFT ATRIUM             Index        RIGHT ATRIUM           Index LA diam:        2.90 cm 1.46 cm/m   RA Area:     16.80 cm LA Vol (A2C):   42.2 ml 21.19 ml/m  RA Volume:   50.60 ml  25.41 ml/m LA Vol (A4C):   24.9 ml 12.50 ml/m LA Biplane Vol: 35.9 ml 18.02 ml/m AORTIC VALVE LVOT Vmax:   96.60 cm/s LVOT Vmean:  63.150 cm/s LVOT VTI:     0.194 m  AORTA Ao Root diam: 3.00 cm Ao Asc diam:  2.80 cm Ao Desc diam: 1.80 cm  MITRAL VALVE MV Area (PHT): 3.78 cm     SHUNTS MV Decel Time: 201 msec     Systemic VTI:  0.19 m MV E velocity: 104.50  cm/s  Systemic Diam: 1.90 cm MV A velocity: 79.75 cm/s MV E/A ratio:  1.31  Sreedhar reddy Madireddy Electronically signed by Vern Claude reddy Madireddy Signature Date/Time: 11/11/2022/12:50:17 PM    Final    MONITORS  LONG TERM MONITOR-LIVE TELEMETRY (3-14 DAYS) 10/30/2022  Narrative Patch Wear Time:  6 days and 23 hours (2024-07-31T16:36:55-0400 to 2024-08-07T16:14:12-398)  There were 49 triggered events all sinus rhythm and sinus tachycardia.  These were occasionally associated with atrial premature contractions.  There were no sinus pauses 3 seconds or greater and no episodes of second or third-degree AV nodal block.  There were no episodes of atrial fibrillation or flutter.  Monitor 1 Patient had a min HR of 49 bpm, max HR of 174 bpm, and avg HR of 81 bpm. Predominant underlying rhythm was Sinus Rhythm.  87 supraventricular runs occurred, the run with the fastest interval lasting 15 beats with a max rate of 130 bpm, the longest lasting 16.9 secs with an avg rate of 108 bpm. Supraventricular Tachycardia was detected within +/- 45 seconds of symptomatic patient event(s).  Isolated SVEs were rare (<1.0%), SVE Couplets were rare (<1.0%), and SVE Triplets were rare (<1.0%).  Isolated VEs were rare (<1.0%), and no VE Couplets or VE Triplets were present.  Monitor 2 Patient had a min HR of 50 bpm, max HR of 179 bpm, and avg HR of 82 bpm. Predominant underlying rhythm was Sinus Rhythm.  Isolated SVEs were rare (<1.0%), and no SVE Couplets or SVE Triplets were present.  No Isolated VEs, VE Couplets, or VE Triplets were present.           Risk Assessment/Calculations:             Physical Exam:   VS:  BP 124/79 (BP Location: Right Arm, Patient Position: Sitting, Cuff  Size: Normal)   Pulse 73   Ht 5\' 4"  (1.626 m)   Wt 203 lb (92.1 kg)   SpO2 99%   BMI 34.84 kg/m    Wt Readings from Last 3 Encounters:  12/04/22 203 lb (92.1 kg)  10/14/22 208 lb 9.6 oz (94.6 kg)  09/07/22 210 lb (95.3 kg)    GEN: Well nourished, well developed in no acute distress NECK: No JVD; No carotid bruits CARDIAC: RRR, no murmurs, rubs, gallops RESPIRATORY:  Clear to auscultation without rales, wheezing or rhonchi  ABDOMEN: Soft, non-tender, non-distended EXTREMITIES:  No edema; No deformity   ASSESSMENT AND PLAN: .   Dysautonomia-she has been referred to the [facility] dysautonomia clinic, waiting to hear from them.  She is compliant with supportive measures including thigh-high compression socks, abdominal binders, liberalization of sodium and water.  Tachycardia-this has been most bothersome for her recently, she previously wore a monitor that showed she is predominantly in sinus rhythm, average heart rate in the 80s.  She was previously prescribed Sectral however her insurance would not pay for this.  Will give her metoprolol to tartrate 25 mg to take once daily, she has some concerns about bradycardia as well and we discussed what to do if this occurs, she can simply discontinue the metoprolol.      Dispo: Follow up in 6 weeks to see if metoprolol is working.   Signed, Flossie Dibble, NP

## 2022-12-03 NOTE — Telephone Encounter (Signed)
Called the patient and explained that her referral to [facility] had been completed and faxed over to [facility] and they should be calling her soon to set-up an appointment. Patient had also requested a disability placard to be signed by Dr. Dulce Sellar. I explained that Dr. Dulce Sellar had agreed to give her a temporary disability placard. She could drop off the form at the front desk and Dr. Dulce Sellar would sign it and then she could pick it up. Patient verbalized understanding and had no further questions at this time.

## 2022-12-04 ENCOUNTER — Encounter: Payer: Self-pay | Admitting: Cardiology

## 2022-12-04 ENCOUNTER — Ambulatory Visit: Payer: Medicaid Other | Attending: Cardiology | Admitting: Cardiology

## 2022-12-04 VITALS — BP 124/79 | HR 73 | Ht 64.0 in | Wt 203.0 lb

## 2022-12-04 DIAGNOSIS — I4711 Inappropriate sinus tachycardia, so stated: Secondary | ICD-10-CM | POA: Diagnosis not present

## 2022-12-04 DIAGNOSIS — R002 Palpitations: Secondary | ICD-10-CM | POA: Diagnosis not present

## 2022-12-04 DIAGNOSIS — G901 Familial dysautonomia [Riley-Day]: Secondary | ICD-10-CM | POA: Diagnosis not present

## 2022-12-04 MED ORDER — METOPROLOL TARTRATE 25 MG PO TABS: 25.0000 mg | ORAL_TABLET | Freq: Every day | ORAL | 3 refills | Status: DC

## 2022-12-04 NOTE — Patient Instructions (Signed)
Medication Instructions:  Your physician has recommended you make the following change in your medication:  Start Metoprolol 25 mg once daily, can take 1 extra for sustained HR of 160  *If you need a refill on your cardiac medications before your next appointment, please call your pharmacy*   Lab Work: NONE If you have labs (blood work) drawn today and your tests are completely normal, you will receive your results only by: MyChart Message (if you have MyChart) OR A paper copy in the mail If you have any lab test that is abnormal or we need to change your treatment, we will call you to review the results.   Testing/Procedures: NONE   Follow-Up: At [facility], you and your health needs are our priority.  As part of our continuing mission to provide you with exceptional heart care, we have created designated Provider Care Teams.  These Care Teams include your primary Cardiologist (physician) and Advanced Practice Providers (APPs -  Physician Assistants and Nurse Practitioners) who all work together to provide you with the care you need, when you need it.  We recommend signing up for the patient portal called "MyChart".  Sign up information is provided on this After Visit Summary.  MyChart is used to connect with patients for Virtual Visits (Telemedicine).  Patients are able to view lab/test results, encounter notes, upcoming appointments, etc.  Non-urgent messages can be sent to your provider as well.   To learn more about what you can do with MyChart, go to [email or web address].    Your next appointment:   6 week(s)  Provider:   Norman Herrlich, MD    Other Instructions

## 2022-12-15 ENCOUNTER — Telehealth: Payer: Self-pay | Admitting: *Deleted

## 2022-12-15 NOTE — Telephone Encounter (Signed)
Let pt know her Disibility Parking Placard is ready for pick up.

## 2023-01-14 NOTE — Progress Notes (Signed)
Cardiology Office Note:    Date:  01/15/2023   ID:  Betty Mcintosh, DOB Jun 13, 2000, MRN [id number]  PCP:  Hadley Pen, MD  Cardiologist:  Norman Herrlich, MD    Referring MD: Hadley Pen, MD    ASSESSMENT:    1. Dysautonomia (HCC)   2. Inappropriate sinus tachycardia (HCC)    PLAN:    In order of problems listed above:  The blood symptoms increase her beta-blocker to twice daily continue to add salt water I do not think midodrine would be of benefit I really need to access those EKGs and rhythm strips to be sure she does not have sinus node reentrant tachycardia Referred to [facility] dysautonomia clinic If she cannot get an appointment will reach out to [facility] Her and her husband desire and are attempting another child ivabradine is not an option   Next appointment: 3 months   Medication Adjustments/Labs and Tests Ordered: Current medicines are reviewed at length with the patient today.  Concerns regarding medicines are outlined above.  No orders of the defined types were placed in this encounter.  No orders of the defined types were placed in this encounter.    History of Present Illness:    Betty Mcintosh is a 22 y.o. female with a hx of dysautonomia last seen 12/04/2022.  Unfortunately had a recent episode of rapid heartbeat and weakness and was seen in emergency room in [place]. Her EKG showed sinus rhythm sinus arrhythmia at 79 bpm  Compliance with diet, lifestyle and medications: yes  We have sent multiple referrals to [facility] syncope dysautonomia clinic they have not responded To [facility] the [facility] program If ineffective she said she will go to [place] to be seen She had another episode in [place] really postural weakness and rapid heart rate She tells me her heart rates exceeded 190 unfortunately there is no documentation but she said she had rhythm strips and EKGs by EMS and she can reach out to see if she can access those. Her heart rates  are running in the 50s to 60s at rest 130 with activity and no drop in blood pressure I am going to do a CT of the head to be sure we do not have Chiari malformation  Past Medical History:  Diagnosis Date   Adverse food reaction 01/30/2020   Angio-edema    Anxiety    Eczema    Low weight gain during pregnancy in third trimester 08/11/2018   Palpitation    Post-dates pregnancy 10/21/2018   Supervision of normal first pregnancy 09/23/2018              Nursing Staff    Provider      Office Location     [facility]    Dating            Language     English     Anatomy US            Flu Vaccine     Declined     Genetic Screen     NIPS:   AFP:   First Screen:  Quad:          TDaP vaccine      declined    Hgb A1C or   GTT    Early   Third trimester self checks       Rhogam     NA         LAB RESULTS  Feeding Plan         Blood Type    A/Posi   SVD (spontaneous vaginal delivery) 10/22/2018    Current Medications: Current Meds  Medication Sig   ferrous sulfate 325 (65 FE) MG EC tablet Take 325 mg by mouth daily with breakfast.   loratadine (CLARITIN) 10 MG tablet Take 10 mg by mouth daily.   metoprolol tartrate (LOPRESSOR) 25 MG tablet Take 1 tablet (25 mg total) by mouth daily. May take 1 Xtra as needed for Sustained HR of 160   Multiple Vitamin (QUINTABS) TABS Take 1 tablet by mouth daily.   sodium chloride 1 g tablet Take 1 tablet (1 g total) by mouth daily.      EKGs/Labs/Other Studies Reviewed:    The following studies were reviewed today:  Cardiac Studies & Procedures       ECHOCARDIOGRAM  ECHOCARDIOGRAM COMPLETE 11/11/2022  Narrative ECHOCARDIOGRAM REPORT    Patient Name:   Betty Mcintosh Date of Exam: 11/11/2022 Medical Rec #:  [id number]   Height:       64.0 in Accession #:    [id number]  Weight:       208.6 lb Date of Birth:  December 09, 2000   BSA:          1.992 m Patient Age:    22 years    BP:           128/88 mmHg Patient Gender: F           HR:           81 bpm. Exam  Location:  [place]  Procedure: 2D Echo, Cardiac Doppler, Color Doppler and Strain Analysis  Indications:    Palpitations [R00.2 (ICD-10-CM)]; Dysautonomia (HCC) [G90.1 (ICD-10-CM)]; Inappropriate sinus tachycardia [I47.11 (ICD-10-CM)]; APC (atrial premature contractions) [I49.1 (ICD-10-CM)]  History:        Patient has no prior history of Echocardiogram examinations. Arrythmias:Tachycardia.  Sonographer:    Margreta Journey RDCS Referring Phys: [id number] Malone Vanblarcom J Zolton Dowson  IMPRESSIONS   1. Left ventricular ejection fraction, by estimation, is 60 to 65%. The left ventricle has normal function. The left ventricle has no regional wall motion abnormalities. Left ventricular diastolic parameters were normal. The average left ventricular global longitudinal strain is -18.0 %. 2. Right ventricular systolic function is normal. The right ventricular size is normal. 3. The mitral valve is grossly normal. No evidence of mitral valve regurgitation. No evidence of mitral stenosis. 4. The aortic valve is tricuspid. Aortic valve regurgitation is not visualized. No aortic stenosis is present. 5. The inferior vena cava is normal in size with greater than 50% respiratory variability, suggesting right atrial pressure of 3 mmHg.  Comparison(s): No prior Echocardiogram.  FINDINGS Left Ventricle: Left ventricular ejection fraction, by estimation, is 60 to 65%. The left ventricle has normal function. The left ventricle has no regional wall motion abnormalities. The average left ventricular global longitudinal strain is -18.0 %. The left ventricular internal cavity size was normal in size. There is no left ventricular hypertrophy. Left ventricular diastolic parameters were normal.  Right Ventricle: The right ventricular size is normal. No increase in right ventricular wall thickness. Right ventricular systolic function is normal.  Left Atrium: Left atrial size was normal in size.  Right Atrium: Right atrial  size was normal in size.  Pericardium: There is no evidence of pericardial effusion.  Mitral Valve: The mitral valve is grossly normal. No evidence of mitral valve regurgitation. No evidence of mitral valve stenosis.  Tricuspid Valve: The  tricuspid valve is not well visualized. Tricuspid valve regurgitation is not demonstrated. No evidence of tricuspid stenosis.  Aortic Valve: The aortic valve is tricuspid. Aortic valve regurgitation is not visualized. No aortic stenosis is present.  Pulmonic Valve: The pulmonic valve was not well visualized. Pulmonic valve regurgitation is not visualized. No evidence of pulmonic stenosis.  Aorta: The aortic root is normal in size and structure.  Venous: The inferior vena cava is normal in size with greater than 50% respiratory variability, suggesting right atrial pressure of 3 mmHg.  IAS/Shunts: No atrial level shunt detected by color flow Doppler.   LEFT VENTRICLE PLAX 2D LVIDd:         4.70 cm   Diastology LVIDs:         3.30 cm   LV e' medial:    9.14 cm/s LV PW:         0.80 cm   LV E/e' medial:  11.4 LV IVS:        0.80 cm   LV e' lateral:   14.30 cm/s LVOT diam:     1.90 cm   LV E/e' lateral: 7.3 LV SV:         55 LV SV Index:   28        2D Longitudinal Strain LVOT Area:     2.84 cm  2D Strain GLS Avg:     -18.0 %   RIGHT VENTRICLE             IVC RV Basal diam:  3.40 cm     IVC diam: 1.60 cm RV Mid diam:    3.20 cm RV S prime:     14.40 cm/s TAPSE (M-mode): 2.9 cm  LEFT ATRIUM             Index        RIGHT ATRIUM           Index LA diam:        2.90 cm 1.46 cm/m   RA Area:     16.80 cm LA Vol (A2C):   42.2 ml 21.19 ml/m  RA Volume:   50.60 ml  25.41 ml/m LA Vol (A4C):   24.9 ml 12.50 ml/m LA Biplane Vol: 35.9 ml 18.02 ml/m AORTIC VALVE LVOT Vmax:   96.60 cm/s LVOT Vmean:  63.150 cm/s LVOT VTI:    0.194 m  AORTA Ao Root diam: 3.00 cm Ao Asc diam:  2.80 cm Ao Desc diam: 1.80 cm  MITRAL VALVE MV Area (PHT): 3.78 cm      SHUNTS MV Decel Time: 201 msec     Systemic VTI:  0.19 m MV E velocity: 104.50 cm/s  Systemic Diam: 1.90 cm MV A velocity: 79.75 cm/s MV E/A ratio:  1.31  Sreedhar reddy Madireddy Electronically signed by Vern Claude reddy Madireddy Signature Date/Time: 11/11/2022/12:50:17 PM    Final    MONITORS  LONG TERM MONITOR-LIVE TELEMETRY (3-14 DAYS) 10/30/2022  Narrative Patch Wear Time:  6 days and 23 hours (2024-07-31T16:36:55-0400 to 2024-08-07T16:14:12-398)  There were 49 triggered events all sinus rhythm and sinus tachycardia.  These were occasionally associated with atrial premature contractions.  There were no sinus pauses 3 seconds or greater and no episodes of second or third-degree AV nodal block.  There were no episodes of atrial fibrillation or flutter.  Monitor 1 Patient had a min HR of 49 bpm, max HR of 174 bpm, and avg HR of 81 bpm. Predominant underlying rhythm was Sinus Rhythm.  87 supraventricular  runs occurred, the run with the fastest interval lasting 15 beats with a max rate of 130 bpm, the longest lasting 16.9 secs with an avg rate of 108 bpm. Supraventricular Tachycardia was detected within +/- 45 seconds of symptomatic patient event(s).  Isolated SVEs were rare (<1.0%), SVE Couplets were rare (<1.0%), and SVE Triplets were rare (<1.0%).  Isolated VEs were rare (<1.0%), and no VE Couplets or VE Triplets were present.  Monitor 2 Patient had a min HR of 50 bpm, max HR of 179 bpm, and avg HR of 82 bpm. Predominant underlying rhythm was Sinus Rhythm.  Isolated SVEs were rare (<1.0%), and no SVE Couplets or SVE Triplets were present.  No Isolated VEs, VE Couplets, or VE Triplets were present.               Recent Labs: 08/27/2022: ALT 25; BUN 15; Creatinine, Ser 0.83; Hemoglobin 14.3; Platelets 235; Potassium 4.0; Sodium 136  Recent Lipid Panel No results found for: "CHOL", "TRIG", "HDL", "CHOLHDL", "VLDL", "LDLCALC", "LDLDIRECT"  Physical Exam:    VS:   BP 128/84 (BP Location: Left Arm, Patient Position: Sitting)   Pulse 69   Ht 5\' 4"  (1.626 m)   Wt 203 lb (92.1 kg)   SpO2 99%   BMI 34.84 kg/m     Wt Readings from Last 3 Encounters:  01/15/23 203 lb (92.1 kg)  12/04/22 203 lb (92.1 kg)  10/14/22 208 lb 9.6 oz (94.6 kg)     GEN:  Well nourished, well developed in no acute distress HEENT: Normal NECK: No JVD; No carotid bruits LYMPHATICS: No lymphadenopathy CARDIAC: RRR, no murmurs, rubs, gallops RESPIRATORY:  Clear to auscultation without rales, wheezing or rhonchi  ABDOMEN: Soft, non-tender, non-distended MUSCULOSKELETAL:  No edema; No deformity  SKIN: Warm and dry NEUROLOGIC:  Alert and oriented x 3 PSYCHIATRIC:  Normal affect    Signed, Norman Herrlich, MD  01/15/2023 10:42 AM    [facility]

## 2023-01-15 ENCOUNTER — Encounter: Payer: Self-pay | Admitting: Cardiology

## 2023-01-15 ENCOUNTER — Ambulatory Visit: Payer: Medicaid Other | Attending: Cardiology | Admitting: Cardiology

## 2023-01-15 VITALS — BP 128/84 | HR 69 | Ht 64.0 in | Wt 203.0 lb

## 2023-01-15 DIAGNOSIS — G901 Familial dysautonomia [Riley-Day]: Secondary | ICD-10-CM | POA: Diagnosis not present

## 2023-01-15 DIAGNOSIS — I4711 Inappropriate sinus tachycardia, so stated: Secondary | ICD-10-CM

## 2023-01-15 MED ORDER — METOPROLOL TARTRATE 25 MG PO TABS: 25.0000 mg | ORAL_TABLET | Freq: Two times a day (BID) | ORAL | 3 refills | Status: DC

## 2023-01-15 MED ORDER — METOPROLOL SUCCINATE ER 25 MG PO TB24: 25.0000 mg | ORAL_TABLET | Freq: Two times a day (BID) | ORAL | 3 refills | Status: DC

## 2023-01-15 NOTE — Addendum Note (Signed)
Addended by: Roxanne Mins I on: 01/15/2023 05:40 PM   Modules accepted: Orders

## 2023-01-15 NOTE — Patient Instructions (Addendum)
Medication Instructions:  Your physician has recommended you make the following change in your medication:   START: Toprol XL 25 mg two times daily  *If you need a refill on your cardiac medications before your next appointment, please call your pharmacy*   Lab Work: None If you have labs (blood work) drawn today and your tests are completely normal, you will receive your results only by: MyChart Message (if you have MyChart) OR A paper copy in the mail If you have any lab test that is abnormal or we need to change your treatment, we will call you to review the results.   Testing/Procedures: Non-Cardiac CT scanning, (CAT scanning), is a noninvasive, special x-ray that produces cross-sectional images of the body using x-rays and a computer. CT scans help physicians diagnose and treat medical conditions. For some CT exams, a contrast material is used to enhance visibility in the area of the body being studied. CT scans provide greater clarity and reveal more details than regular x-ray exams.    Follow-Up: At [facility], you and your health needs are our priority.  As part of our continuing mission to provide you with exceptional heart care, we have created designated Provider Care Teams.  These Care Teams include your primary Cardiologist (physician) and Advanced Practice Providers (APPs -  Physician Assistants and Nurse Practitioners) who all work together to provide you with the care you need, when you need it.  We recommend signing up for the patient portal called "MyChart".  Sign up information is provided on this After Visit Summary.  MyChart is used to connect with patients for Virtual Visits (Telemedicine).  Patients are able to view lab/test results, encounter notes, upcoming appointments, etc.  Non-urgent messages can be sent to your provider as well.   To learn more about what you can do with MyChart, go to [email or web address].    Your next appointment:   3  month(s)  Provider:   Wallis Bamberg, NP Rosalita Levan)  Other Instructions Will send a referral to [facility] per Dr. Dulce Sellar

## 2023-01-18 ENCOUNTER — Telehealth: Payer: Self-pay | Admitting: Cardiology

## 2023-01-18 DIAGNOSIS — D3500 Benign neoplasm of unspecified adrenal gland: Secondary | ICD-10-CM

## 2023-01-18 NOTE — Telephone Encounter (Signed)
Called patient and informed her of the lab work that Dr. Dulce Sellar was requesting. Patient stated that she would be able to come by the office and have the labs drawn on Friday (01/22/23). Patient verbalized understanding and had no further questions at this time.

## 2023-01-25 ENCOUNTER — Encounter: Payer: Self-pay | Admitting: Cardiology

## 2023-01-25 ENCOUNTER — Telehealth: Payer: Self-pay | Admitting: Cardiology

## 2023-01-25 NOTE — Telephone Encounter (Signed)
Patient has an appt with specialist at [facility] tomorrow for her Dysautonomia and would like to know if there are any question Dr. Dulce Sellar would like for her to ask tomorrow.  Best number is [phone]

## 2023-02-01 ENCOUNTER — Telehealth: Payer: Self-pay

## 2023-02-01 LAB — CATECHOLAMINES, FRACTIONATED, URINE, 24 HOUR
Dopamine , 24H Ur: 298 ug/(24.h) (ref 0–510)
Dopamine, Rand Ur: 221 ug/L
Epinephrine, 24H Ur: 5 ug/(24.h) (ref 0–20)
Epinephrine, Rand Ur: 4 ug/L
Norepinephrine, 24H Ur: 31 ug/(24.h) (ref 0–135)
Norepinephrine, Rand Ur: 23 ug/L

## 2023-02-01 LAB — METANEPHRINES, PLASMA
Metanephrine, Free: 33.1 pg/mL (ref 0.0–88.0)
Normetanephrine, Free: 30.1 pg/mL (ref 0.0–210.1)

## 2023-02-01 NOTE — Telephone Encounter (Signed)
See 02/01/23 MyChart message

## 2023-02-08 ENCOUNTER — Ambulatory Visit (HOSPITAL_BASED_OUTPATIENT_CLINIC_OR_DEPARTMENT_OTHER): Payer: Medicaid Other

## 2023-02-18 ENCOUNTER — Encounter: Payer: Self-pay | Admitting: Cardiology

## 2023-02-26 ENCOUNTER — Encounter: Payer: Self-pay | Admitting: *Deleted

## 2023-02-26 NOTE — Progress Notes (Unsigned)
Cardiology Office Note:  .   Date:  03/01/2023  ID:  Betty Mcintosh, DOB [birth year], MRN [id number] PCP: Hadley Pen, MD  [facility] Providers  Cardiologist:  None    History of Present Illness: .   Betty Mcintosh is a 22 y.o. female with a past medical history of dysautonomia, Ehlers-Danlos syndrome, angioedema, anxiety, palpitations.  02/02/23 Tilt table - felt to be consistent with POTS diagnosed by Dr. Lanae Boast @ [facility] 02/01/23 CT head negative for Chiari 10/22/2022 echo EF 60 to 65%, no valvular abnormalities 10/14/2022 monitor predominant rhythm was sinus, average heart rate 81 bpm, 87 episodes of SVT occurred  She establish care with Dr. Dulce Sellar on 10/14/2022 for evaluation of dizziness, weakness, heart racing.  She was advised to increase her water intake, use thigh-high support hose as well as an abdominal binder, and take salt tablets daily.  She presents today for follow-up of her dysautonomia.  She seems to be doing somewhat better although she was recently in [place] at [employer] and had an episode where she needed to go to the emergency department, heart rate was staying elevated greater than 180, it was felt dehydration was likely playing a part in this.  She is compliant with supportive measures, she is drinking adequate fluids, she is taking salt tablets, she is wearing thigh-high compression stockings as well as an abdominal binder.  Dr. Dulce Sellar previously referred her to [facility] for the dysautonomia clinic.   Evaluated in the dysautonomia clinic at [facility] by Dr. Lanae Boast, she underwent tilt table evaluation which was consistent with POTS, also diagnosed with Ehlers-Danlos syndrome and referred to outpatient vascular.  She presents today for routine follow-up of her dysautonomia, she is doing okay from a cardiac perspective, when she was hospitalized all of her medications were stopped (sodium and metoprolol) and she definitely felt poorly off of this, feels back in her baseline.   Currently taking 6 g of sodium per day, metoprolol 25 mg twice daily. She denies chest pain, dyspnea, pnd, orthopnea, n, v, dizziness, syncope, edema, weight gain, or early satiety.   ROS: Review of Systems  Constitutional:  Positive for malaise/fatigue.  HENT: Negative.    Eyes: Negative.   Respiratory: Negative.    Cardiovascular:  Positive for palpitations.  Gastrointestinal:        GI distress  Genitourinary: Negative.   Musculoskeletal: Negative.   Skin: Negative.   Neurological:  Positive for dizziness and weakness.  Endo/Heme/Allergies: Negative.   Psychiatric/Behavioral: Negative.       Studies Reviewed: .    Cardiac Studies & Procedures      ECHOCARDIOGRAM  ECHOCARDIOGRAM COMPLETE 11/11/2022  Narrative ECHOCARDIOGRAM REPORT    Patient Name:   Betty Mcintosh Date of Exam: 11/11/2022 Medical Rec #:  [id number]   Height:       64.0 in Accession #:    [id number]  Weight:       208.6 lb Date of Birth:  Mar 07, 2001   BSA:          1.992 m Patient Age:    22 years    BP:           128/88 mmHg Patient Gender: F           HR:           81 bpm. Exam Location:  [place]  Procedure: 2D Echo, Cardiac Doppler, Color Doppler and Strain Analysis  Indications:    Palpitations [R00.2 (ICD-10-CM)]; Dysautonomia (HCC) [G90.1 (ICD-10-CM)]; Inappropriate sinus tachycardia [  I47.11 (ICD-10-CM)]; APC (atrial premature contractions) [I49.1 (ICD-10-CM)]  History:        Patient has no prior history of Echocardiogram examinations. Arrythmias:Tachycardia.  Sonographer:    Margreta Journey RDCS Referring Phys: [id number] BRIAN J MUNLEY  IMPRESSIONS   1. Left ventricular ejection fraction, by estimation, is 60 to 65%. The left ventricle has normal function. The left ventricle has no regional wall motion abnormalities. Left ventricular diastolic parameters were normal. The average left ventricular global longitudinal strain is -18.0 %. 2. Right ventricular systolic function is normal. The  right ventricular size is normal. 3. The mitral valve is grossly normal. No evidence of mitral valve regurgitation. No evidence of mitral stenosis. 4. The aortic valve is tricuspid. Aortic valve regurgitation is not visualized. No aortic stenosis is present. 5. The inferior vena cava is normal in size with greater than 50% respiratory variability, suggesting right atrial pressure of 3 mmHg.  Comparison(s): No prior Echocardiogram.  FINDINGS Left Ventricle: Left ventricular ejection fraction, by estimation, is 60 to 65%. The left ventricle has normal function. The left ventricle has no regional wall motion abnormalities. The average left ventricular global longitudinal strain is -18.0 %. The left ventricular internal cavity size was normal in size. There is no left ventricular hypertrophy. Left ventricular diastolic parameters were normal.  Right Ventricle: The right ventricular size is normal. No increase in right ventricular wall thickness. Right ventricular systolic function is normal.  Left Atrium: Left atrial size was normal in size.  Right Atrium: Right atrial size was normal in size.  Pericardium: There is no evidence of pericardial effusion.  Mitral Valve: The mitral valve is grossly normal. No evidence of mitral valve regurgitation. No evidence of mitral valve stenosis.  Tricuspid Valve: The tricuspid valve is not well visualized. Tricuspid valve regurgitation is not demonstrated. No evidence of tricuspid stenosis.  Aortic Valve: The aortic valve is tricuspid. Aortic valve regurgitation is not visualized. No aortic stenosis is present.  Pulmonic Valve: The pulmonic valve was not well visualized. Pulmonic valve regurgitation is not visualized. No evidence of pulmonic stenosis.  Aorta: The aortic root is normal in size and structure.  Venous: The inferior vena cava is normal in size with greater than 50% respiratory variability, suggesting right atrial pressure of 3  mmHg.  IAS/Shunts: No atrial level shunt detected by color flow Doppler.   LEFT VENTRICLE PLAX 2D LVIDd:         4.70 cm   Diastology LVIDs:         3.30 cm   LV e' medial:    9.14 cm/s LV PW:         0.80 cm   LV E/e' medial:  11.4 LV IVS:        0.80 cm   LV e' lateral:   14.30 cm/s LVOT diam:     1.90 cm   LV E/e' lateral: 7.3 LV SV:         55 LV SV Index:   28        2D Longitudinal Strain LVOT Area:     2.84 cm  2D Strain GLS Avg:     -18.0 %   RIGHT VENTRICLE             IVC RV Basal diam:  3.40 cm     IVC diam: 1.60 cm RV Mid diam:    3.20 cm RV S prime:     14.40 cm/s TAPSE (M-mode): 2.9 cm  LEFT ATRIUM  Index        RIGHT ATRIUM           Index LA diam:        2.90 cm 1.46 cm/m   RA Area:     16.80 cm LA Vol (A2C):   42.2 ml 21.19 ml/m  RA Volume:   50.60 ml  25.41 ml/m LA Vol (A4C):   24.9 ml 12.50 ml/m LA Biplane Vol: 35.9 ml 18.02 ml/m AORTIC VALVE LVOT Vmax:   96.60 cm/s LVOT Vmean:  63.150 cm/s LVOT VTI:    0.194 m  AORTA Ao Root diam: 3.00 cm Ao Asc diam:  2.80 cm Ao Desc diam: 1.80 cm  MITRAL VALVE MV Area (PHT): 3.78 cm     SHUNTS MV Decel Time: 201 msec     Systemic VTI:  0.19 m MV E velocity: 104.50 cm/s  Systemic Diam: 1.90 cm MV A velocity: 79.75 cm/s MV E/A ratio:  1.31  Sreedhar reddy Madireddy Electronically signed by Vern Claude reddy Madireddy Signature Date/Time: 11/11/2022/12:50:17 PM    Final   MONITORS  LONG TERM MONITOR-LIVE TELEMETRY (3-14 DAYS) 10/30/2022  Narrative Patch Wear Time:  6 days and 23 hours (2024-07-31T16:36:55-0400 to 2024-08-07T16:14:12-398)  There were 49 triggered events all sinus rhythm and sinus tachycardia.  These were occasionally associated with atrial premature contractions.  There were no sinus pauses 3 seconds or greater and no episodes of second or third-degree AV nodal block.  There were no episodes of atrial fibrillation or flutter.  Monitor 1 Patient had a min HR of 49 bpm,  max HR of 174 bpm, and avg HR of 81 bpm. Predominant underlying rhythm was Sinus Rhythm.  87 supraventricular runs occurred, the run with the fastest interval lasting 15 beats with a max rate of 130 bpm, the longest lasting 16.9 secs with an avg rate of 108 bpm. Supraventricular Tachycardia was detected within +/- 45 seconds of symptomatic patient event(s).  Isolated SVEs were rare (<1.0%), SVE Couplets were rare (<1.0%), and SVE Triplets were rare (<1.0%).  Isolated VEs were rare (<1.0%), and no VE Couplets or VE Triplets were present.  Monitor 2 Patient had a min HR of 50 bpm, max HR of 179 bpm, and avg HR of 82 bpm. Predominant underlying rhythm was Sinus Rhythm.  Isolated SVEs were rare (<1.0%), and no SVE Couplets or SVE Triplets were present.  No Isolated VEs, VE Couplets, or VE Triplets were present.           Risk Assessment/Calculations:             Physical Exam:   VS:  BP (!) 110/52   Pulse 68   Ht 5\' 4"  (1.626 m)   Wt 202 lb 6.4 oz (91.8 kg)   SpO2 98%   BMI 34.74 kg/m    Wt Readings from Last 3 Encounters:  03/01/23 202 lb 6.4 oz (91.8 kg)  01/15/23 203 lb (92.1 kg)  12/04/22 203 lb (92.1 kg)    GEN: Well nourished, well developed in no acute distress NECK: No JVD; No carotid bruits CARDIAC: RRR, no murmurs, rubs, gallops RESPIRATORY:  Clear to auscultation without rales, wheezing or rhonchi  ABDOMEN: Soft, non-tender, non-distended EXTREMITIES:  No edema; No deformity   ASSESSMENT AND PLAN: .   Dysautonomia-She is compliant with supportive measures including thigh-high compression socks, abdominal binders, liberalization of sodium @ 6 /day and water.  Continue metoprolol 25 mg twice daily.  Been evaluated by Dr. Lanae Boast at [facility], underwent tilt table  evaluation that was felt to be consistent with POTS--she has a virtual visit for follow-up of symptoms at the end of January with one of his APP's.  Ehlers-Danlos syndrome-most bothersome for his fatigue and  weakness, he is working to get approval for wheelchair at home with [facility].   Tachycardia-currently well-controlled metoprolol tartrate 25 mg twice daily.      Dispo: Keep follow-up with general cardiology in February.  Signed, Flossie Dibble, NP

## 2023-03-01 ENCOUNTER — Encounter: Payer: Self-pay | Admitting: Cardiology

## 2023-03-01 ENCOUNTER — Ambulatory Visit: Payer: Medicaid Other | Attending: Cardiology | Admitting: Cardiology

## 2023-03-01 VITALS — BP 110/52 | HR 68 | Ht 64.0 in | Wt 202.4 lb

## 2023-03-01 DIAGNOSIS — Q796 Ehlers-Danlos syndrome, unspecified: Secondary | ICD-10-CM | POA: Diagnosis not present

## 2023-03-01 DIAGNOSIS — G901 Familial dysautonomia [Riley-Day]: Secondary | ICD-10-CM

## 2023-03-01 DIAGNOSIS — R002 Palpitations: Secondary | ICD-10-CM

## 2023-03-01 NOTE — Patient Instructions (Signed)
Medication Instructions:  Your physician recommends that you continue on your current medications as directed. Please refer to the Current Medication list given to you today.  *If you need a refill on your cardiac medications before your next appointment, please call your pharmacy*   Lab Work: NONE If you have labs (blood work) drawn today and your tests are completely normal, you will receive your results only by: MyChart Message (if you have MyChart) OR A paper copy in the mail If you have any lab test that is abnormal or we need to change your treatment, we will call you to review the results.   Testing/Procedures: NONE   Follow-Up: At [facility], you and your health needs are our priority.  As part of our continuing mission to provide you with exceptional heart care, we have created designated Provider Care Teams.  These Care Teams include your primary Cardiologist (physician) and Advanced Practice Providers (APPs -  Physician Assistants and Nurse Practitioners) who all work together to provide you with the care you need, when you need it.  We recommend signing up for the patient portal called "MyChart".  Sign up information is provided on this After Visit Summary.  MyChart is used to connect with patients for Virtual Visits (Telemedicine).  Patients are able to view lab/test results, encounter notes, upcoming appointments, etc.  Non-urgent messages can be sent to your provider as well.   To learn more about what you can do with MyChart, go to [email or web address].    Your next appointment:  Keep appointment on Feb. 2nd 2025    Provider:   Wallis Bamberg, NP Rosalita Levan)    Other Instructions

## 2023-03-02 ENCOUNTER — Ambulatory Visit: Payer: Medicaid Other | Admitting: Cardiology

## 2023-03-08 ENCOUNTER — Telehealth: Payer: Self-pay | Admitting: Cardiology

## 2023-03-08 NOTE — Telephone Encounter (Signed)
Spoke with pt and advised that any referrals would need to come from her PCP.

## 2023-03-08 NOTE — Telephone Encounter (Signed)
Patient called wanting to know if she would be able to get a referral for to a chiropractor. She has the appt today.  She would like to go to Dr. Cyd Silence.

## 2023-03-19 ENCOUNTER — Encounter: Payer: Self-pay | Admitting: Cardiology

## 2023-03-23 ENCOUNTER — Encounter: Payer: Self-pay | Admitting: Allergy

## 2023-03-23 ENCOUNTER — Ambulatory Visit: Payer: Medicaid Other | Admitting: Allergy

## 2023-03-23 VITALS — BP 118/70 | HR 82 | Temp 97.9°F | Resp 18 | Ht 63.58 in | Wt 203.0 lb

## 2023-03-23 DIAGNOSIS — J3089 Other allergic rhinitis: Secondary | ICD-10-CM | POA: Diagnosis not present

## 2023-03-23 DIAGNOSIS — G90A Postural orthostatic tachycardia syndrome (POTS): Secondary | ICD-10-CM

## 2023-03-23 DIAGNOSIS — D4709 Other mast cell neoplasms of uncertain behavior: Secondary | ICD-10-CM | POA: Diagnosis not present

## 2023-03-23 DIAGNOSIS — Z713 Dietary counseling and surveillance: Secondary | ICD-10-CM

## 2023-03-23 DIAGNOSIS — Q796 Ehlers-Danlos syndrome, unspecified: Secondary | ICD-10-CM

## 2023-03-23 DIAGNOSIS — T781XXD Other adverse food reactions, not elsewhere classified, subsequent encounter: Secondary | ICD-10-CM

## 2023-03-23 DIAGNOSIS — L299 Pruritus, unspecified: Secondary | ICD-10-CM

## 2023-03-23 NOTE — Progress Notes (Signed)
 New Patient Note  RE: Betty Mcintosh MRN: [id number] DOB: May 07, 2000 Date of Office Visit: 03/23/2023  Consult requested by: Betty Mcintosh LABOR, MD Primary care provider: Brinda Mardy FERNS, NP  Chief Complaint: Allergic Reaction (Wants to be tested for Mast Cell )  History of Present Illness: I had the pleasure of seeing Betty Mcintosh for initial evaluation at the [facility] on 03/23/2023. She is a 23 y.o. female, who is self-referred here for the evaluation of mast cell issues.  She is accompanied today by her mother who provided/contributed to the history.   Last seen in our office in 2021.   Discussed the use of AI scribe software for clinical note transcription with the patient, who gave verbal consent to proceed.  The patient, diagnosed with dysautonomia and Ehlers-Danlos Syndrome (EDS), presents with recent onset of food-related reactions. These reactions, which began approximately four to five months ago, are characterized by chest and throat tightness, itchy mouth, reflux of undigested food, and palpitations. The palpitations are described as an increase of 20-30 beats per minute above her resting heart rate. These symptoms typically occur after consuming only a few bites of food and can last up to two hours. The patient has attempted elimination diets, removing gluten and dairy, but the reactions persist, even with seemingly benign foods like plain grilled chicken and rice.  In addition to these symptoms, the patient reports daily nasal congestion upon waking, reflux or heartburn most nights, occasional diarrhea, and significant bloating. She also experiences flushing and itching, particularly after eating and exposure to water. The patient has been taking loratadine daily for approximately two years. Recently, she added famotidine to her regimen, which seems to have reduced the frequency of her food-related issues as she had none the past week.   The patient has a history of  lymphedema in the right foot, which began around the same time as her dysautonomia symptoms seven years ago. She is currently under evaluation for pelvic congestion syndrome. The patient sees two cardiologists, one at [facility] and one at [facility]. She has not yet seen a gastroenterologist.     Dietary History: patient has been eating other foods including milk, eggs, peanut, treenuts, sesame, shellfish, fish, soy, wheat, meats, fruits and vegetables.  Assessment and Plan: Betty Mcintosh is a 23 y.o. female with: Dietary counseling and surveillance Patient reports inconsistent reactions to various foods, including chest and throat tightness, reflux, and tachycardia. Symptoms occur shortly after eating and resolve within a couple of hours. No clear trigger identified. Currently taking Loratadine and recently started Famotidine with good benefit. 2021 select food skin testing and select bloodwork for foods were negative. Discussed with patient and mother that skin prick testing and bloodwork (food IgE levels) checks for IgE mediated reactions which her clinical presentation does not support.  Keep a food journal with symptoms and foods eaten. Recommend GI evaluation. Get select food panel via bloodwork.   POTS (postural orthostatic tachycardia syndrome) EDS (Ehlers-Danlos syndrome) Rule out mast cell disorder Diagnosed with POT and EDS. Follows with cardiology. Concerned about mast cell issues.  Discussed that mast cell disorders are very rare. Will get some basic bloodwork first.   Other allergic rhinitis Takes loratadine daily. Get bloodwork for environmental panel.  Pruritus Generalized pruritus at times. No clear triggers noted. Start loratadine 10mg  twice a day. If symptoms are not controlled or causes drowsiness let us  know. Start Pepcid (famotidine) 20mg  twice a day.  Avoid the following potential triggers: alcohol, tight  clothing, NSAIDs, hot showers and getting overheated. See below for proper skin  care.  Get bloodwork.  Return in about 2 months (around 05/21/2023).  No orders of the defined types were placed in this encounter.  Lab Orders         Allergens w/Total IgE Area 2         Alpha-Gal Panel         ANA, IFA (with reflex)         C3 and C4         CBC with Differential/Platelet         Chronic Urticaria         Comprehensive metabolic panel         C-reactive protein         Food Allergy  Profile         Sedimentation rate         Tryptase      Other allergy  screening: Asthma: no Rhino conjunctivitis: yes Takes loratadine daily.  Medication allergy : no Hymenoptera allergy : no Urticaria: no Eczema: well controlled  History of recurrent infections suggestive of immunodeficency: no  Diagnostics: None.   Past Medical History: Patient Active Problem List   Diagnosis Date Noted   Angio-edema    Eczema    Palpitation    Adverse food reaction 01/30/2020   SVD (spontaneous vaginal delivery) 10/22/2018   Post-dates pregnancy 10/21/2018   Supervision of normal first pregnancy 09/23/2018   Low weight gain during pregnancy in third trimester 08/11/2018   Past Medical History:  Diagnosis Date   Adverse food reaction 01/30/2020   Angio-edema    Anxiety    Dysautonomia (HCC)    Eczema    Ehlers-Danlos syndrome    Low weight gain during pregnancy in third trimester 08/11/2018   Palpitation    Post-dates pregnancy 10/21/2018   Supervision of normal first pregnancy 09/23/2018              Nursing Staff    Provider      Office Location     [facility]    Dating            Language     English     Anatomy US             Flu Vaccine     Declined     Genetic Screen     NIPS:   AFP:   First Screen:  Quad:          TDaP vaccine      declined    Hgb A1C or   GTT    Early   Third trimester self checks       Rhogam     NA         LAB RESULTS       Feeding Plan         Blood Type    A/Posi   SVD (spontaneous vaginal delivery) 10/22/2018   Past Surgical History: Past Surgical  History:  Procedure Laterality Date   NO PAST SURGERIES     Medication List:  Current Outpatient Medications  Medication Sig Dispense Refill   loratadine (CLARITIN) 10 MG tablet Take 10 mg by mouth daily.     metoprolol  tartrate (LOPRESSOR ) 25 MG tablet Take 1 tablet (25 mg total) by mouth 2 (two) times daily. 180 tablet 3   sodium chloride  1 g tablet Take 1 tablet (1 g total) by mouth daily. 90 tablet 3  No current facility-administered medications for this visit.   Allergies: Allergies  Allergen Reactions   Other Cough and Shortness Of Breath    Other Reaction(s): Headache   Lactose Intolerance (Gi) Other (See Comments)    Migraine and acid reflux   Tilactase Other (See Comments)    Migraine and acid reflux   Aspirin     Other Reaction(s): Other  Experiences fatigue/weakness   Lactose Nausea Only   Social History: Social History   Socioeconomic History   Marital status: Married    Spouse name: Not on file   Number of children: 1   Years of education: Not on file   Highest education level: Not on file  Occupational History   Not on file  Tobacco Use   Smoking status: Never   Smokeless tobacco: Never  Vaping Use   Vaping status: Never Used  Substance and Sexual Activity   Alcohol use: Never   Drug use: Never   Sexual activity: Yes    Birth control/protection: None  Other Topics Concern   Not on file  Social History Narrative   Not on file   Social Drivers of Health   Financial Resource Strain: Low Risk  (10/12/2018)   Overall Financial Resource Strain (CARDIA)    Difficulty of Paying Living Expenses: Not hard at all  Food Insecurity: No Food Insecurity (10/12/2018)   Hunger Vital Sign    Worried About Running Out of Food in the Last Year: Never true    Ran Out of Food in the Last Year: Never true  Transportation Needs: Unknown (10/12/2018)   PRAPARE - Administrator, Civil Service (Medical): No    Lack of Transportation (Non-Medical): Not on  file  Physical Activity: Not on file  Stress: No Stress Concern Present (10/12/2018)   [employer] of Occupational Health - Occupational Stress Questionnaire    Feeling of Stress : Only a little  Social Connections: Not on file   Lives in a house. Smoking: denies Occupation: stay at home mom  Environmental History: Water Damage/mildew in the house: yes Carpet in the family room: yes Carpet in the bedroom: yes Heating: electric Cooling: central Pet: no  Family History: Family History  Problem Relation Age of Onset   Hypertension Mother    Eczema Mother    Angioedema Mother    Eczema Father    Eczema Sister    Angioedema Maternal Grandfather    Breast cancer Neg Hx    Colon cancer Neg Hx    Ovarian cancer Neg Hx    Review of Systems  Constitutional:  Negative for appetite change, chills, fever and unexpected weight change.  HENT:  Positive for congestion. Negative for rhinorrhea.   Eyes:  Negative for itching.  Respiratory:  Positive for shortness of breath. Negative for cough, chest tightness and wheezing.   Cardiovascular:  Negative for chest pain.  Gastrointestinal:  Positive for nausea. Negative for abdominal pain.  Genitourinary:  Negative for difficulty urinating.  Musculoskeletal:        Leg lymphedema on right side  Skin:  Negative for rash.       pruritus    Objective: BP 118/70   Pulse 82   Temp 97.9 F (36.6 C)   Resp 18   Ht 5' 3.58 (1.615 m)   Wt 203 lb (92.1 kg)   SpO2 96%   BMI 35.30 kg/m  Body mass index is 35.3 kg/m. Physical Exam Vitals and nursing note reviewed.  Constitutional:  Appearance: Normal appearance. She is well-developed.  HENT:     Head: Normocephalic and atraumatic.     Right Ear: Tympanic membrane and external ear normal.     Left Ear: Tympanic membrane and external ear normal.     Nose: Nose normal.     Mouth/Throat:     Mouth: Mucous membranes are moist.     Pharynx: Oropharynx is clear.  Eyes:      Conjunctiva/sclera: Conjunctivae normal.  Cardiovascular:     Rate and Rhythm: Normal rate and regular rhythm.     Heart sounds: Normal heart sounds. No murmur heard.    No friction rub. No gallop.  Pulmonary:     Effort: Pulmonary effort is normal.     Breath sounds: Normal breath sounds. No wheezing, rhonchi or rales.  Musculoskeletal:     Cervical back: Neck supple.     Left lower leg: Edema (non-pitting) present.  Skin:    General: Skin is warm.     Findings: No rash.  Neurological:     Mental Status: She is alert and oriented to person, place, and time.  Psychiatric:        Behavior: Behavior normal.   The plan was reviewed with the patient/family, and all questions/concerned were addressed.  It was my pleasure to see Betty Mcintosh today and participate in her care. Please feel free to contact me with any questions or concerns.  Sincerely,  Orlan Cramp, DO Allergy  & Immunology  [facility]  [facility] office: [phone] [facility] office: [phone]

## 2023-03-23 NOTE — Patient Instructions (Addendum)
 Mast cell disorders are very rare I don't think you have food allergies as you have been eating a wide variety of diet the past week with no issues. I do recommend GI evaluation.   Start loratadine 10mg  twice a day. If symptoms are not controlled or causes drowsiness let us  know. Start Pepcid (famotidine) 20mg  twice a day.  Avoid the following potential triggers: alcohol, tight clothing, NSAIDs, hot showers and getting overheated. See below for proper skin care.  Get bloodwork. We are ordering labs, so please allow 1-2 weeks for the results to come back. With the newly implemented Cures Act, the labs might be visible to you at the same time that they become visible to me. However, I will not address the results until all of the results are back, so please be patient.   Keep track of symptoms.  Follow up with your all other specialists.   Return in about 2 months (around 05/21/2023). Or sooner if needed.  Skin care recommendations  Bath time: Always use lukewarm water. AVOID very hot or cold water. Keep bathing time to 5-10 minutes. Do NOT use bubble bath. Use a mild soap and use just enough to wash the dirty areas. Do NOT scrub skin vigorously.  After bathing, pat dry your skin with a towel. Do NOT rub or scrub the skin.  Moisturizers and prescriptions:  ALWAYS apply moisturizers immediately after bathing (within 3 minutes). This helps to lock-in moisture. Use the moisturizer several times a day over the whole body. Good summer moisturizers include: Aveeno, CeraVe, Cetaphil. Good winter moisturizers include: Aquaphor, Vaseline, Cerave, Cetaphil, Eucerin, Vanicream. When using moisturizers along with medications, the moisturizer should be applied about one hour after applying the medication to prevent diluting effect of the medication or moisturize around where you applied the medications. When not using medications, the moisturizer can be continued twice daily as  maintenance.  Laundry and clothing: Avoid laundry products with added color or perfumes. Use unscented hypo-allergenic laundry products such as Tide free, Cheer free & gentle, and All free and clear.  If the skin still seems dry or sensitive, you can try double-rinsing the clothes. Avoid tight or scratchy clothing such as wool. Do not use fabric softeners or dyer sheets.

## 2023-03-31 ENCOUNTER — Other Ambulatory Visit: Payer: Self-pay | Admitting: Allergy

## 2023-04-09 ENCOUNTER — Encounter: Payer: Self-pay | Admitting: Allergy

## 2023-04-09 LAB — ALLERGENS W/TOTAL IGE AREA 2

## 2023-04-09 LAB — CBC WITH DIFFERENTIAL/PLATELET
Basophils Absolute: 0 10*3/uL (ref 0.0–0.2)
Basos: 0 %
EOS (ABSOLUTE): 0 10*3/uL (ref 0.0–0.4)
Eos: 0 %
Hematocrit: 45.3 % (ref 34.0–46.6)
Hemoglobin: 14.4 g/dL (ref 11.1–15.9)
Immature Grans (Abs): 0 10*3/uL (ref 0.0–0.1)
Immature Granulocytes: 0 %
Lymphocytes Absolute: 2 10*3/uL (ref 0.7–3.1)
Lymphs: 28 %
MCH: 30.2 pg (ref 26.6–33.0)
MCHC: 31.8 g/dL (ref 31.5–35.7)
MCV: 95 fL (ref 79–97)
Monocytes Absolute: 0.3 10*3/uL (ref 0.1–0.9)
Monocytes: 4 %
Neutrophils Absolute: 4.9 10*3/uL (ref 1.4–7.0)
Neutrophils: 68 %
Platelets: 239 10*3/uL (ref 150–450)
RBC: 4.77 x10E6/uL (ref 3.77–5.28)
RDW: 11.7 % (ref 11.7–15.4)
WBC: 7.3 10*3/uL (ref 3.4–10.8)

## 2023-04-09 LAB — FOOD ALLERGY PROFILE
Allergen Corn, IgE: <0.1 kU/L
Clam IgE: <0.1 kU/L
Codfish IgE: <0.1 kU/L
Egg White IgE: <0.1 kU/L
Milk IgE: <0.1 kU/L
Peanut IgE: <0.1 kU/L
Scallop IgE: <0.1 kU/L
Sesame Seed IgE: <0.1 kU/L
Shrimp IgE: <0.1 kU/L
Soybean IgE: <0.1 kU/L
Walnut IgE: <0.1 kU/L
Wheat IgE: <0.1 kU/L

## 2023-04-09 LAB — CHRONIC URTICARIA: cu index: 1.6 (ref <10)

## 2023-04-09 LAB — SEDIMENTATION RATE: Sed Rate: 6 mm/h (ref 0–32)

## 2023-04-09 LAB — COMPREHENSIVE METABOLIC PANEL
ALT: 24 [IU]/L (ref 0–32)
AST: 21 [IU]/L (ref 0–40)
Albumin: 4.5 g/dL (ref 4.0–5.0)
Alkaline Phosphatase: 120 [IU]/L (ref 44–121)
BUN/Creatinine Ratio: 15 (ref 9–23)
BUN: 11 mg/dL (ref 6–20)
Bilirubin Total: 0.5 mg/dL (ref 0.0–1.2)
CO2: 20 mmol/L (ref 20–29)
Calcium: 9.7 mg/dL (ref 8.7–10.2)
Chloride: 104 mmol/L (ref 96–106)
Creatinine, Ser: 0.75 mg/dL (ref 0.57–1.00)
Globulin, Total: 2.5 g/dL (ref 1.5–4.5)
Glucose: 86 mg/dL (ref 70–99)
Potassium: 4.3 mmol/L (ref 3.5–5.2)
Sodium: 142 mmol/L (ref 134–144)
Total Protein: 7 g/dL (ref 6.0–8.5)
eGFR: 115 mL/min/{1.73_m2} (ref >59)

## 2023-04-09 LAB — ALPHA-GAL PANEL
Allergen Lamb IgE: <0.1 kU/L
Beef IgE: <0.1 kU/L
IgE (Immunoglobulin E), Serum: 44 [IU]/mL (ref 6–495)
O215-IgE Alpha-Gal: <0.1 kU/L
Pork IgE: <0.1 kU/L

## 2023-04-09 LAB — ANTINUCLEAR ANTIBODIES, IFA: ANA Titer 1: NEGATIVE

## 2023-04-09 LAB — C3 AND C4
Complement C3, Serum: 183 mg/dL — ABNORMAL HIGH (ref 82–167)
Complement C4, Serum: 19 mg/dL (ref 12–38)

## 2023-04-09 LAB — TRYPTASE: Tryptase: 4.8 ug/L (ref 2.2–13.2)

## 2023-04-09 LAB — C-REACTIVE PROTEIN: CRP: 6 mg/L (ref 0–10)

## 2023-04-19 ENCOUNTER — Ambulatory Visit: Payer: Medicaid Other | Admitting: Cardiology

## 2023-05-21 ENCOUNTER — Ambulatory Visit: Payer: Medicaid Other | Admitting: Internal Medicine

## 2024-04-12 ENCOUNTER — Ambulatory Visit: Admitting: Allergy and Immunology

## 2024-04-12 ENCOUNTER — Encounter: Payer: Self-pay | Admitting: Allergy and Immunology

## 2024-04-12 VITALS — BP 120/72 | HR 64 | Resp 18 | Ht 63.2 in | Wt 220.0 lb

## 2024-04-12 DIAGNOSIS — R110 Nausea: Secondary | ICD-10-CM | POA: Diagnosis not present

## 2024-04-12 DIAGNOSIS — R232 Flushing: Secondary | ICD-10-CM | POA: Diagnosis not present

## 2024-04-12 DIAGNOSIS — I9589 Other hypotension: Secondary | ICD-10-CM

## 2024-04-12 DIAGNOSIS — R42 Dizziness and giddiness: Secondary | ICD-10-CM

## 2024-04-12 DIAGNOSIS — J383 Other diseases of vocal cords: Secondary | ICD-10-CM

## 2024-04-12 DIAGNOSIS — G901 Familial dysautonomia [Riley-Day]: Secondary | ICD-10-CM | POA: Diagnosis not present

## 2024-04-12 MED ORDER — LEVOCETIRIZINE DIHYDROCHLORIDE 5 MG PO TABS: ORAL_TABLET | ORAL | 5 refills | Status: AC

## 2024-04-12 MED ORDER — FAMOTIDINE 20 MG PO TABS: 20.0000 mg | ORAL_TABLET | Freq: Two times a day (BID) | ORAL | 5 refills | Status: AC

## 2024-04-12 NOTE — Progress Notes (Unsigned)
 "   - [place] [facility] [place] - [place] - [place]   Follow-up Note  Referring Provider: Brinda Mardy FERNS, NP Primary Provider: Brinda Mardy FERNS, NP Date of Office Visit: 04/12/2024  Subjective:   Betty Mcintosh (DOB: [birth year]) is a 24 y.o. 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  I have never seen her in this clinic and her initial patient visit with Dr. Luke was 20 September 2023.  She has apparently been under the care of [facility] for dysautonomia and mast cell activating syndrome.  Apparently she develops flareups described as when she eats food she gets tingling of her tongue and throat thickening and closing and inability to swallow and sometimes some coughing and nausea and she cannot eat anymore.  In addition, she has intermittent episodes of flushing once again sometimes associated with eating and sometimes without eating, has chronic nausea, has exertional lightheadedness without any vertigo and a drop in her blood pressure during exercise and also has a multitude of other systemic symptoms involving multiple organ systems..  She has been given a multitude of various medications including H1 and H2 receptor blocker, cromolyn, ketotifen and various other treatments which have not helped her at all.  Most recently she has been given Nebivolol which has actually made her exercise-induced dizziness and most of her symptoms much worse.  Allergies as of 04/12/2024       Reactions   Other Cough, Shortness Of Breath   Other Reaction(s): Headache   Lactose Intolerance (gi) Other (See Comments)   Migraine and acid reflux   Tilactase Other (See Comments)   Migraine and acid reflux   Aspirin    Other Reaction(s): Other Experiences fatigue/weakness   Lactose Nausea Only        Medication List     cromolyn 100 MG/5ML solution Commonly known as: GASTROCROM Take 200 mg by mouth. Puts into her drink   FIBER PO Take by mouth daily.   ketotifen 0.035 % ophthalmic solution Commonly known as: ZADITOR 4 drops 3 (three) times daily with meals.   levocetirizine 5 MG tablet Commonly known as: XYZAL  Take 5 mg by mouth 3 (three) times daily with meals.   MULTIVITAMIN PO Take by mouth daily.   nebivolol 5 MG tablet Commonly known as: BYSTOLIC Take 7.5 mg by mouth.   NUTRITIONAL SUPPLEMENT PO Take by mouth. NaturDao Plus supplement   PEPCID  PO Take 12.5 mg by mouth 3 (three) times daily with meals.   sodium chloride  1 g tablet Take 1 tablet (1 g total) by mouth daily.    Past Medical History:  Diagnosis Date   Adverse food reaction 01/30/2020   Angio-edema    Anxiety    Dysautonomia (HCC)    Eczema    Ehlers-Danlos syndrome    Inappropriate sinus tachycardia    Low weight gain during pregnancy in third trimester 08/11/2018   Lymphedema    Palpitation    Post-dates pregnancy 10/21/2018   POTS (postural orthostatic tachycardia syndrome)    Supervision of normal first pregnancy 09/23/2018              Nursing Staff    Provider      Office Location     [facility]    Dating            Language  English     Anatomy US             Flu Vaccine     Declined     Genetic Screen     NIPS:   AFP:   First Screen:  Quad:          TDaP vaccine      declined    Hgb A1C or   GTT    Early   Third trimester self checks       Rhogam     NA         LAB RESULTS       Feeding Plan         Blood Type    A/Posi   SVD (spontaneous vaginal delivery) 10/22/2018    Past Surgical History:  Procedure Laterality Date   NO PAST SURGERIES      Review of systems negative except as noted in HPI / PMHx or noted below:  Review of Systems  Constitutional: Negative.   HENT: Negative.    Eyes: Negative.   Respiratory: Negative.    Cardiovascular: Negative.   Gastrointestinal: Negative.    Genitourinary: Negative.   Musculoskeletal: Negative.   Skin: Negative.   Neurological: Negative.   Endo/Heme/Allergies: Negative.   Psychiatric/Behavioral: Negative.       Objective:   Vitals:   04/12/24 1345  BP: 120/72  Pulse: 64  Resp: 18  SpO2: 98%   Height: 5' 3.2 (160.5 cm)  Weight: 220 lb (99.8 kg)   Physical Exam -deferred  Diagnostics:   Review blood test obtained 31 March 2023 identifies creatinine 0.83 mg/DL, AST 75L/O, ALT 74L/O, WBC 7.3, absolute eosinophil 0, absolute lymphocyte 2000, hemoglobin 14.4, platelet 239, negative alpha gal panel with IgE 44 KU/L, tryptase 4.8 UG/L, negative area two aeroallergen IgE profile, negative food screen IgE profile CRP 6 ANA negative  Results of urine test obtained 25 January 2023 identifies -24-hour dopamine, epinephrine, norepinephrine, epinephrine abnormalities.  Assessment and Plan:   1. Dysautonomia (HCC)   2. Exertional hypotension   3. Flushing   4. Dizziness   5. Nausea   6. Vocal cord dysfunction    1. Obtain blood test: autoimmune dysautonomia panel, C-Kit genetic mutation, metanephrines, tryptase  2. Obtain urine test: 24 hr Leukotriene E4, 24 hr 5-HIAA, spot PGD2  3. Discontinue cromolyn and ketotifen as these are not helping  4. Ask cardiologist about discontinuing Nebivolol as this is making you worse  5. Use the following every day:   A. Xyzal  5 mg - 2 tablet 2 times per day  B. Famotidine  20 mg - 2 times per day  6. Further treatment???  7. Need to engage in a slowly progressive aerobic exercise program. Cardiac rehab??? Water aerobics???  8. Influenza = Tamiflu. Covid = Paxlovid  9. Return to clinic in 4 weeks or earlier if problem.   Betty Mcintosh has a constellation of signs and symptoms that are difficult to categorize into a known disease state.  Certainly she could have a component of dysautonomia and we will check to see if she has autoimmune dysautonomia with the blood test noted above  and if so consider giving her immunoglobulin infusion.  There does not appear to be much evidence of a mast cell defect but we are going to further evaluate this issue by checking for overproduction of leukotrienes and prostaglandins and also look for a C-kit genetic mutation.  I think she is stuck in a situation where she has been given  a lot of medicines which have not helped her and now she is piling up her medication list and I think she can stop her cromolyn and ketotifen as they are not helping and also discontinue her Nebivolol as this appears to be making things worse.  For now she will stay on an H1 and H2 receptor blocker on a consistent basis.  She probably has rather significant cardiac deconditioning as she has really done nothing for the past several years that can strengthen her muscles or condition her heart and I think we are going to need to have her enter her into cardiac rehab or water aerobics on a pretty regular basis aiming for a very slowly progressive aerobic exercise experience.  Once we have all the results from her blood and urine back for review we will discuss about entering into the 1 of these programs.  She appears to have a component of vocal cord dysfunction complicating her issues and we will discuss that condition with her in more detail when she returns to this clinic.  Betty Denis, MD Allergy  / Immunology [facility] "

## 2024-04-12 NOTE — Patient Instructions (Addendum)
" °  1. Obtain blood test: autoimmune dysautonomia panel, C-Kit genetic mutation, metanephrines, tryptase  2. Obtain urine test: 24 hr Leukotriene E4, 24 hr 5-HIAA, spot PGD2  3. Discontinue cromolyn and ketotifen as these are not helping  4. Ask cardiologist about discontinuing Nebivolol as this is making you worse  5. Use the following every day:   A. Xyzal  5 mg - 2 tablet 2 times per day  B. Famotidine  20 mg - 2 times per day  6. Further treatment???  7. Need to engage in a slowly progressive aerobic exercise program. Cardiac rehab??? Water aerobics???  8. Influenza = Tamiflu. Covid = Paxlovid  9. Return to clinic in 4 weeks or earlier if problem.   "

## 2024-04-13 ENCOUNTER — Encounter: Payer: Self-pay | Admitting: Allergy and Immunology
